# Patient Record
Sex: Male | Born: 1955
Health system: Southern US, Community
[De-identification: ages and names within clinical notes are randomized; demographics above are authoritative.]

## PROBLEM LIST (undated history)

## (undated) DIAGNOSIS — Z8669 Personal history of other diseases of the nervous system and sense organs: Secondary | ICD-10-CM

## (undated) DIAGNOSIS — F1011 Alcohol abuse, in remission: Secondary | ICD-10-CM

## (undated) DIAGNOSIS — Z789 Other specified health status: Secondary | ICD-10-CM

## (undated) HISTORY — DX: Personal history of other diseases of the nervous system and sense organs: Z86.69

## (undated) HISTORY — DX: Alcohol abuse, in remission: F10.11

---

## 1993-03-20 HISTORY — PX: KNEE ARTHROSCOPY: SHX127

## 2001-05-19 ENCOUNTER — Emergency Department (HOSPITAL_COMMUNITY): Admission: EM | Admit: 2001-05-19 | Discharge: 2001-05-19 | Payer: Self-pay | Admitting: Emergency Medicine

## 2002-10-13 ENCOUNTER — Encounter: Admission: RE | Admit: 2002-10-13 | Discharge: 2002-10-13 | Payer: Self-pay | Admitting: Internal Medicine

## 2002-10-13 ENCOUNTER — Encounter: Payer: Self-pay | Admitting: Internal Medicine

## 2007-01-29 ENCOUNTER — Ambulatory Visit (HOSPITAL_COMMUNITY): Admission: RE | Admit: 2007-01-29 | Discharge: 2007-01-29 | Payer: Self-pay | Admitting: Chiropractic Medicine

## 2013-09-09 ENCOUNTER — Ambulatory Visit: Payer: Self-pay | Admitting: Medical

## 2014-01-23 ENCOUNTER — Ambulatory Visit: Payer: Self-pay | Admitting: General Practice

## 2014-02-06 ENCOUNTER — Ambulatory Visit: Payer: Self-pay | Admitting: General Practice

## 2014-02-27 ENCOUNTER — Ambulatory Visit (INDEPENDENT_AMBULATORY_CARE_PROVIDER_SITE_OTHER): Payer: Self-pay | Admitting: Surgery

## 2014-02-27 NOTE — H&P (Signed)
ouglas D. York 02/27/2014 9:51 AM Location: [facility] Patient #: [id number] DOB: 07/08/1955 Married / Language: English / Race: White Male History of Present Illness (Derrick Lainez A. Cortavius Montesinos MD; 02/27/2014 12:14 PM) Patient words: hernia  RIH  PT PRESENTS FOR RIH. PT HAS HAD PAIN AND SWELLING RIGHT GROIN LAST 3 MONTHS.. PAIN AND BULGE IN RIGHT GROIN. NO PAIN IN LEFT GROIN. MADE WORSE WITH LIFTING.  The patient is a 58 year old male   Other Problems (Derrick York, CMA; 02/27/2014 9:51 AM) No pertinent past medical history  Past Surgical History (Derrick York, CMA; 02/27/2014 9:51 AM) Knee Surgery Right.  Diagnostic Studies History (Derrick York, CMA; 02/27/2014 9:51 AM) Colonoscopy never  Allergies (Derrick York, CMA; 02/27/2014 9:52 AM) No Known Drug Allergies 02/27/2014  Medication History (Derrick York, CMA; 02/27/2014 9:52 AM) No Current Medications  Social History (Derrick York, CMA; 02/27/2014 9:51 AM) Alcohol use Remotely quit alcohol use. Caffeine use Tea. Illicit drug use Remotely quit drug use. Tobacco use Former smoker.  Family History (Derrick York, CMA; 02/27/2014 9:51 AM) Alcohol Abuse Father, Mother. Hypertension Mother. Malignant Neoplasm Of Pancreas Mother.     Review of Systems (Derrick York CMA; 02/27/2014 9:51 AM) General Not Present- Appetite Loss, Chills, Fatigue, Fever, Night Sweats, Weight Gain and Weight Loss. Skin Not Present- Change in Wart/Mole, Dryness, Hives, Jaundice, New Lesions, Non-Healing Wounds, Rash and Ulcer. HEENT Not Present- Earache, Hearing Loss, Hoarseness, Nose Bleed, Oral Ulcers, Ringing in the Ears, Seasonal Allergies, Sinus Pain, Sore Throat, Visual Disturbances, Wears glasses/contact lenses and Yellow Eyes. Respiratory Not Present- Bloody sputum, Chronic Cough, Difficulty Breathing, Snoring and Wheezing. Breast Not Present- Breast Mass, Breast Pain, Nipple Discharge and Skin Changes. Cardiovascular Not  Present- Chest Pain, Difficulty Breathing Lying Down, Leg Cramps, Palpitations, Rapid Heart Rate, Shortness of Breath and Swelling of Extremities. Gastrointestinal Not Present- Abdominal Pain, Bloating, Bloody Stool, Change in Bowel Habits, Chronic diarrhea, Constipation, Difficulty Swallowing, Excessive gas, Gets full quickly at meals, Hemorrhoids, Indigestion, Nausea, Rectal Pain and Vomiting. Male Genitourinary Not Present- Blood in Urine, Change in Urinary Stream, Frequency, Impotence, Nocturia, Painful Urination, Urgency and Urine Leakage. Musculoskeletal Not Present- Back Pain, Joint Pain, Joint Stiffness, Muscle Pain, Muscle Weakness and Swelling of Extremities. Neurological Not Present- Decreased Memory, Fainting, Headaches, Numbness, Seizures, Tingling, Tremor, Trouble walking and Weakness. Psychiatric Not Present- Anxiety, Bipolar, Change in Sleep Pattern, Depression, Fearful and Frequent crying. Endocrine Not Present- Cold Intolerance, Excessive Hunger, Hair Changes, Heat Intolerance, Hot flashes and New Diabetes. Hematology Not Present- Easy Bruising, Excessive bleeding, Gland problems, HIV and Persistent Infections.  Vitals (Derrick York CMA; 02/27/2014 9:52 AM) 02/27/2014 9:51 AM Weight: 190 lb Height: 72in Body Surface Area: 2.09 m Body Mass Index: 25.77 kg/m Temp.: 97F(Temporal)  Pulse: 78 (Regular)  BP: 126/80 (Sitting, Left Arm, Standard)     Physical Exam (Derrick York A. Derrick Gohlke MD; 02/27/2014 12:15 PM)  General Mental Status-Alert. General Appearance-Consistent with stated age. Hydration-Well hydrated. Voice-Normal.  Head and Neck Head-normocephalic, atraumatic with no lesions or palpable masses. Trachea-midline.  Eye Eyeball - Bilateral-Extraocular movements intact. Sclera/Conjunctiva - Bilateral-No scleral icterus.  Chest and Lung Exam Chest and lung exam reveals -quiet, even and easy respiratory effort with no use of accessory  muscles and on auscultation, normal breath sounds, no adventitious sounds and normal vocal resonance. Inspection Chest Wall - Normal. Back - normal.  Cardiovascular Cardiovascular examination reveals -normal heart sounds, regular rate and rhythm with no murmurs and normal pedal pulses bilaterally.  Abdomen Inspection Skin - Scar - no   surgical scars. Hernias - Inguinal hernia - Right - Reducible. Palpation/Percussion Palpation and Percussion of the abdomen reveal - Soft, Non Tender, No Rebound tenderness, No Rigidity (guarding) and No hepatosplenomegaly. Auscultation Auscultation of the abdomen reveals - Bowel sounds normal.  Neurologic Neurologic evaluation reveals -alert and oriented x 3 with no impairment of recent or remote memory. Mental Status-Normal.  Musculoskeletal Normal Exam - Left-Upper Extremity Strength Normal and Lower Extremity Strength Normal. Normal Exam - Right-Upper Extremity Strength Normal, Lower Extremity Weakness.    Assessment & Plan (Derrick York A. Derrick Mineo MD; 02/27/2014 10:14 AM)  UNILATERAL INGUINAL HERNIA WITHOUT OBSTRUCTION OR GANGRENE, RECURRENCE NOT SPECIFIED (550.90  K40.90) Impression: RIH reducible. PT DESIRES REPAIR. The risk of hernia repair include bleeding, infection, organ injury, bowel injury, bladder injury, nerve injury recurrent hernia, blood clots, worsening of underlying condition, chronic pain, mesh use, open surgery, death, and the need for other operattions. Pt agrees to proceed. DISCUSSED LAPAROSCOPIC AND OPEN REPAIR WITH MESH.  Current Plans Pt Education - [facility] Umbilical/ Inguinal Hernia [facility] 

## 2014-03-09 ENCOUNTER — Encounter (HOSPITAL_BASED_OUTPATIENT_CLINIC_OR_DEPARTMENT_OTHER)
Admission: RE | Admit: 2014-03-09 | Discharge: 2014-03-09 | Disposition: A | Discharge status: Home / Self Care | Payer: Self-pay | Source: Ambulatory Visit | Attending: Surgery | Admitting: Surgery

## 2014-03-09 ENCOUNTER — Encounter (HOSPITAL_BASED_OUTPATIENT_CLINIC_OR_DEPARTMENT_OTHER): Payer: Self-pay | Admitting: *Deleted

## 2014-03-09 DIAGNOSIS — Z87891 Personal history of nicotine dependence: Secondary | ICD-10-CM | POA: Diagnosis not present

## 2014-03-09 DIAGNOSIS — K4090 Unilateral inguinal hernia, without obstruction or gangrene, not specified as recurrent: Secondary | ICD-10-CM | POA: Diagnosis not present

## 2014-03-09 LAB — COMPREHENSIVE METABOLIC PANEL
ALK PHOS: 68 U/L (ref 39–117)
ALT: 71 U/L — ABNORMAL HIGH (ref 0–53)
ANION GAP: 12 (ref 5–15)
AST: 68 U/L — ABNORMAL HIGH (ref 0–37)
Albumin: 4 g/dL (ref 3.5–5.2)
BILIRUBIN TOTAL: 0.5 mg/dL (ref 0.3–1.2)
BUN: 9 mg/dL (ref 6–23)
CO2: 29 mEq/L (ref 19–32)
CREATININE: 0.76 mg/dL (ref 0.50–1.35)
Calcium: 9.4 mg/dL (ref 8.4–10.5)
Chloride: 101 mEq/L (ref 96–112)
GFR calc Af Amer: >90 mL/min (ref >90)
GLUCOSE: 82 mg/dL (ref 70–99)
Potassium: 4.3 mEq/L (ref 3.7–5.3)
SODIUM: 142 meq/L (ref 137–147)
Total Protein: 7.3 g/dL (ref 6.0–8.3)

## 2014-03-09 LAB — CBC WITH DIFFERENTIAL/PLATELET
BASOS PCT: 1 % (ref 0–1)
Basophils Absolute: 0 10*3/uL (ref 0.0–0.1)
EOS ABS: 0.1 10*3/uL (ref 0.0–0.7)
EOS PCT: 2 % (ref 0–5)
HCT: 44 % (ref 39.0–52.0)
Hemoglobin: 15.4 g/dL (ref 13.0–17.0)
Lymphocytes Relative: 40 % (ref 12–46)
Lymphs Abs: 2.6 10*3/uL (ref 0.7–4.0)
MCH: 30.6 pg (ref 26.0–34.0)
MCHC: 35 g/dL (ref 30.0–36.0)
MCV: 87.3 fL (ref 78.0–100.0)
Monocytes Absolute: 0.6 10*3/uL (ref 0.1–1.0)
Monocytes Relative: 9 % (ref 3–12)
Neutro Abs: 3.2 10*3/uL (ref 1.7–7.7)
Neutrophils Relative %: 48 % (ref 43–77)
PLATELETS: 157 10*3/uL (ref 150–400)
RBC: 5.04 MIL/uL (ref 4.22–5.81)
RDW: 12.7 % (ref 11.5–15.5)
WBC: 6.5 10*3/uL (ref 4.0–10.5)

## 2014-03-11 ENCOUNTER — Encounter (HOSPITAL_BASED_OUTPATIENT_CLINIC_OR_DEPARTMENT_OTHER): Payer: Self-pay | Admitting: Anesthesiology

## 2014-03-11 ENCOUNTER — Ambulatory Visit (HOSPITAL_BASED_OUTPATIENT_CLINIC_OR_DEPARTMENT_OTHER)
Admission: RE | Admit: 2014-03-11 | Discharge: 2014-03-11 | Disposition: A | Discharge status: Home / Self Care | Payer: BC Managed Care – PPO | Source: Ambulatory Visit | Attending: Surgery | Admitting: Surgery

## 2014-03-11 ENCOUNTER — Ambulatory Visit (HOSPITAL_BASED_OUTPATIENT_CLINIC_OR_DEPARTMENT_OTHER): Payer: BC Managed Care – PPO | Admitting: Anesthesiology

## 2014-03-11 ENCOUNTER — Encounter (HOSPITAL_BASED_OUTPATIENT_CLINIC_OR_DEPARTMENT_OTHER)
Admission: RE | Disposition: A | Discharge status: Home / Self Care | Payer: Self-pay | Source: Ambulatory Visit | Attending: Surgery

## 2014-03-11 DIAGNOSIS — K4090 Unilateral inguinal hernia, without obstruction or gangrene, not specified as recurrent: Secondary | ICD-10-CM | POA: Insufficient documentation

## 2014-03-11 DIAGNOSIS — Z87891 Personal history of nicotine dependence: Secondary | ICD-10-CM | POA: Insufficient documentation

## 2014-03-11 HISTORY — PX: INGUINAL HERNIA REPAIR: SHX194

## 2014-03-11 HISTORY — PX: INSERTION OF MESH: SHX5868

## 2014-03-11 HISTORY — DX: Other specified health status: Z78.9

## 2014-03-11 SURGERY — REPAIR, HERNIA, INGUINAL, ADULT
Anesthesia: General | Laterality: Right

## 2014-03-11 MED ORDER — ROCURONIUM BROMIDE 100 MG/10ML IV SOLN
INTRAVENOUS | Status: DC | PRN
  Administered 2014-03-11: 40 mg via INTRAVENOUS
  Administered 2014-03-11: 10 mg via INTRAVENOUS

## 2014-03-11 MED ORDER — HYDROMORPHONE HCL 1 MG/ML IJ SOLN
INTRAMUSCULAR | Status: AC
  Filled 2014-03-11: qty 1

## 2014-03-11 MED ORDER — LACTATED RINGERS IV SOLN
INTRAVENOUS | Status: DC
  Administered 2014-03-11 (×2): via INTRAVENOUS

## 2014-03-11 MED ORDER — MIDAZOLAM HCL 2 MG/2ML IJ SOLN
INTRAMUSCULAR | Status: AC
  Filled 2014-03-11: qty 2

## 2014-03-11 MED ORDER — PROPOFOL 10 MG/ML IV BOLUS
INTRAVENOUS | Status: DC | PRN
  Administered 2014-03-11: 200 mg via INTRAVENOUS

## 2014-03-11 MED ORDER — FENTANYL CITRATE 0.05 MG/ML IJ SOLN
INTRAMUSCULAR | Status: AC
  Filled 2014-03-11: qty 2

## 2014-03-11 MED ORDER — CHLORHEXIDINE GLUCONATE 4 % EX LIQD: 1.0000 "application " | Freq: Once | CUTANEOUS | Status: DC

## 2014-03-11 MED ORDER — OXYCODONE-ACETAMINOPHEN 5-325 MG PO TABS: 1.0000 | ORAL_TABLET | ORAL | Status: DC | PRN

## 2014-03-11 MED ORDER — FENTANYL CITRATE 0.05 MG/ML IJ SOLN
50.0000 ug | INTRAMUSCULAR | Status: DC | PRN
  Administered 2014-03-11: 100 ug via INTRAVENOUS

## 2014-03-11 MED ORDER — LIDOCAINE HCL (CARDIAC) 20 MG/ML IV SOLN
INTRAVENOUS | Status: DC | PRN
  Administered 2014-03-11: 80 mg via INTRAVENOUS

## 2014-03-11 MED ORDER — DEXAMETHASONE SODIUM PHOSPHATE 4 MG/ML IJ SOLN
INTRAMUSCULAR | Status: DC | PRN
  Administered 2014-03-11: 10 mg via INTRAVENOUS

## 2014-03-11 MED ORDER — SCOPOLAMINE 1 MG/3DAYS TD PT72: 1.0000 | MEDICATED_PATCH | TRANSDERMAL | Status: DC

## 2014-03-11 MED ORDER — HYDROMORPHONE HCL 1 MG/ML IJ SOLN
0.2500 mg | INTRAMUSCULAR | Status: DC | PRN
  Administered 2014-03-11: 0.25 mg via INTRAVENOUS

## 2014-03-11 MED ORDER — ONDANSETRON HCL 4 MG/2ML IJ SOLN: 4.0000 mg | Freq: Once | INTRAMUSCULAR | Status: DC | PRN

## 2014-03-11 MED ORDER — GLYCOPYRROLATE 0.2 MG/ML IJ SOLN
INTRAMUSCULAR | Status: DC | PRN
  Administered 2014-03-11: 0.4 mg via INTRAVENOUS

## 2014-03-11 MED ORDER — BUPIVACAINE-EPINEPHRINE 0.25% -1:200000 IJ SOLN
INTRAMUSCULAR | Status: DC | PRN
  Administered 2014-03-11: 10 mL

## 2014-03-11 MED ORDER — ONDANSETRON HCL 4 MG/2ML IJ SOLN
INTRAMUSCULAR | Status: DC | PRN
  Administered 2014-03-11: 4 mg via INTRAVENOUS

## 2014-03-11 MED ORDER — DEXTROSE 5 % IV SOLN
3.0000 g | INTRAVENOUS | Status: AC
  Administered 2014-03-11: 2 g via INTRAVENOUS

## 2014-03-11 MED ORDER — LIDOCAINE HCL 4 % MT SOLN
OROMUCOSAL | Status: DC | PRN
  Administered 2014-03-11: 3 mL via TOPICAL

## 2014-03-11 MED ORDER — BUPIVACAINE-EPINEPHRINE (PF) 0.5% -1:200000 IJ SOLN
INTRAMUSCULAR | Status: AC
  Filled 2014-03-11: qty 30

## 2014-03-11 MED ORDER — SCOPOLAMINE 1 MG/3DAYS TD PT72
MEDICATED_PATCH | TRANSDERMAL | Status: AC
  Filled 2014-03-11: qty 1

## 2014-03-11 MED ORDER — NEOSTIGMINE METHYLSULFATE 10 MG/10ML IV SOLN
INTRAVENOUS | Status: DC | PRN
Start: 2014-03-11 — End: 2014-03-11
  Administered 2014-03-11: 3 mg via INTRAVENOUS

## 2014-03-11 MED ORDER — CEFAZOLIN SODIUM-DEXTROSE 2-3 GM-% IV SOLR
INTRAVENOUS | Status: AC
  Filled 2014-03-11: qty 50

## 2014-03-11 MED ORDER — FENTANYL CITRATE 0.05 MG/ML IJ SOLN
INTRAMUSCULAR | Status: AC
  Filled 2014-03-11: qty 4

## 2014-03-11 MED ORDER — FENTANYL CITRATE 0.05 MG/ML IJ SOLN
INTRAMUSCULAR | Status: DC | PRN
  Administered 2014-03-11 (×2): 50 ug via INTRAVENOUS

## 2014-03-11 MED ORDER — PROPOFOL 10 MG/ML IV BOLUS
INTRAVENOUS | Status: AC
  Filled 2014-03-11: qty 20

## 2014-03-11 MED ORDER — MIDAZOLAM HCL 2 MG/2ML IJ SOLN
1.0000 mg | INTRAMUSCULAR | Status: DC | PRN
  Administered 2014-03-11: 2 mg via INTRAVENOUS

## 2014-03-11 MED ORDER — BUPIVACAINE-EPINEPHRINE (PF) 0.5% -1:200000 IJ SOLN
INTRAMUSCULAR | Status: DC | PRN
  Administered 2014-03-11: 25 mL via PERINEURAL

## 2014-03-11 MED ORDER — EPHEDRINE SULFATE 50 MG/ML IJ SOLN
INTRAMUSCULAR | Status: DC | PRN
  Administered 2014-03-11 (×2): 10 mg via INTRAVENOUS

## 2014-03-11 SURGICAL SUPPLY — 49 items
BLADE CLIPPER SURG (BLADE) ×2 IMPLANT
BLADE SURG 15 STRL LF DISP TIS (BLADE) ×1 IMPLANT
BLADE SURG 15 STRL SS (BLADE) ×1
CANISTER SUCT 1200ML W/VALVE (MISCELLANEOUS) ×2 IMPLANT
CHLORAPREP W/TINT 26ML (MISCELLANEOUS) ×2 IMPLANT
COVER BACK TABLE 60X90IN (DRAPES) ×2 IMPLANT
COVER MAYO STAND STRL (DRAPES) ×2 IMPLANT
DECANTER SPIKE VIAL GLASS SM (MISCELLANEOUS) ×2 IMPLANT
DRAIN PENROSE 1/2X12 LTX STRL (WOUND CARE) ×2 IMPLANT
DRAPE LAPAROTOMY TRNSV 102X78 (DRAPE) ×2 IMPLANT
DRAPE UTILITY XL STRL (DRAPES) ×2 IMPLANT
ELECT COATED BLADE 2.86 ST (ELECTRODE) ×2 IMPLANT
ELECT REM PT RETURN 9FT ADLT (ELECTROSURGICAL) ×2
ELECTRODE REM PT RTRN 9FT ADLT (ELECTROSURGICAL) ×1 IMPLANT
GAUZE SPONGE 4X4 16PLY XRAY LF (GAUZE/BANDAGES/DRESSINGS) IMPLANT
GLOVE BIOGEL M STRL SZ7.5 (GLOVE) ×2 IMPLANT
GLOVE BIOGEL PI IND STRL 7.0 (GLOVE) ×1 IMPLANT
GLOVE BIOGEL PI IND STRL 8 (GLOVE) ×2 IMPLANT
GLOVE BIOGEL PI INDICATOR 7.0 (GLOVE) ×1
GLOVE BIOGEL PI INDICATOR 8 (GLOVE) ×2
GLOVE ECLIPSE 8.0 STRL XLNG CF (GLOVE) ×2 IMPLANT
GOWN STRL REUS W/ TWL LRG LVL3 (GOWN DISPOSABLE) ×2 IMPLANT
GOWN STRL REUS W/TWL LRG LVL3 (GOWN DISPOSABLE) ×2
LIQUID BAND (GAUZE/BANDAGES/DRESSINGS) ×2 IMPLANT
MESH HERNIA SYS ULTRAPRO LRG (Mesh General) ×2 IMPLANT
NEEDLE HYPO 25X1 1.5 SAFETY (NEEDLE) ×2 IMPLANT
NS IRRIG 1000ML POUR BTL (IV SOLUTION) ×2 IMPLANT
PACK BASIN DAY SURGERY FS (CUSTOM PROCEDURE TRAY) ×2 IMPLANT
PENCIL BUTTON HOLSTER BLD 10FT (ELECTRODE) ×2 IMPLANT
SLEEVE SCD COMPRESS KNEE MED (MISCELLANEOUS) ×2 IMPLANT
SPONGE GAUZE 4X4 12PLY STER LF (GAUZE/BANDAGES/DRESSINGS) IMPLANT
SPONGE LAP 4X18 X RAY DECT (DISPOSABLE) ×2 IMPLANT
STAPLER VISISTAT 35W (STAPLE) IMPLANT
SUT MON AB 4-0 PC3 18 (SUTURE) ×2 IMPLANT
SUT NOVA 0 T19/GS 22DT (SUTURE) ×4 IMPLANT
SUT VIC AB 0 SH 27 (SUTURE) ×2 IMPLANT
SUT VIC AB 2-0 SH 27 (SUTURE) ×1
SUT VIC AB 2-0 SH 27XBRD (SUTURE) ×1 IMPLANT
SUT VIC AB 3-0 54X BRD REEL (SUTURE) IMPLANT
SUT VIC AB 3-0 BRD 54 (SUTURE)
SUT VICRYL 3-0 CR8 SH (SUTURE) ×2 IMPLANT
SUT VICRYL AB 2 0 TIE (SUTURE) IMPLANT
SUT VICRYL AB 2 0 TIES (SUTURE)
SYR CONTROL 10ML LL (SYRINGE) ×2 IMPLANT
TAPE HYPAFIX 4 X10 (GAUZE/BANDAGES/DRESSINGS) IMPLANT
TOWEL OR 17X24 6PK STRL BLUE (TOWEL DISPOSABLE) ×2 IMPLANT
TOWEL OR NON WOVEN STRL DISP B (DISPOSABLE) ×2 IMPLANT
TUBE CONNECTING 20X1/4 (TUBING) ×2 IMPLANT
YANKAUER SUCT BULB TIP NO VENT (SUCTIONS) ×2 IMPLANT

## 2014-03-11 NOTE — Progress Notes (Signed)
Assisted Dr. Crews with right, ultrasound guided, transabdominal plane block. Side rails up, monitors on throughout procedure. See vital signs in flow sheet. Tolerated Procedure well. 

## 2014-03-11 NOTE — Anesthesia Procedure Notes (Addendum)
Procedure Name: Intubation Date/Time: 03/11/2014 8:34 AM Performed by: Maryella Shivers Pre-anesthesia Checklist: Patient identified, Emergency Drugs available, Suction available and Patient being monitored Patient Re-evaluated:Patient Re-evaluated prior to inductionOxygen Delivery Method: Circle System Utilized Preoxygenation: Pre-oxygenation with 100% oxygen Intubation Type: IV induction Ventilation: Mask ventilation without difficulty Laryngoscope Size: Mac and 3 Grade View: Grade I Tube type: Oral Tube size: 8.0 mm Number of attempts: 1 Airway Equipment and Method: stylet and oral airway Placement Confirmation: ETT inserted through vocal cords under direct vision,  positive ETCO2 and breath sounds checked- equal and bilateral Secured at: 24 cm Tube secured with: Tape Dental Injury: Teeth and Oropharynx as per pre-operative assessment    Anesthesia Regional Block:  TAP block  Pre-Anesthetic Checklist: ,, timeout performed, Correct Patient, Correct Site, Correct Laterality, Correct Procedure, Correct Position, site marked, Risks and benefits discussed,  Surgical consent,  Pre-op evaluation,  At surgeon's request and post-op pain management  Laterality: Right and Lower  Prep: chloraprep       Needles:  Injection technique: Single-shot  Needle Type: Echogenic Needle     Needle Length: 9cm 9 cm Needle Gauge: 21 and 21 G    Additional Needles:  Procedures: ultrasound guided (picture in chart) TAP block Narrative:  Start time: 03/11/2014 8:10 AM End time: 03/11/2014 8:16 AM Injection made incrementally with aspirations every 5 mL.  Performed by: Personally  Anesthesiologist: [place], [place]

## 2014-03-11 NOTE — Anesthesia Preprocedure Evaluation (Signed)
Anesthesia Evaluation  Patient identified by MRN, date of birth, ID band Patient awake    Reviewed: Allergy & Precautions, H&P , NPO status , Patient's Chart, lab work & pertinent test results  Airway Mallampati: I  TM Distance: >3 FB Neck ROM: Full    Dental  (+) Teeth Intact, Dental Advisory Given   Pulmonary former smoker,  breath sounds clear to auscultation        Cardiovascular Rhythm:Regular Rate:Normal     Neuro/Psych    GI/Hepatic   Endo/Other    Renal/GU      Musculoskeletal   Abdominal   Peds  Hematology   Anesthesia Other Findings   Reproductive/Obstetrics                             Anesthesia Physical Anesthesia Plan  ASA: II  Anesthesia Plan: General   Post-op Pain Management:    Induction: Intravenous  Airway Management Planned: LMA and Oral ETT  Additional Equipment:   Intra-op Plan:   Post-operative Plan: Extubation in OR  Informed Consent: I have reviewed the patients History and Physical, chart, labs and discussed the procedure including the risks, benefits and alternatives for the proposed anesthesia with the patient or authorized representative who has indicated his/her understanding and acceptance.   Dental advisory given  Plan Discussed with: CRNA, Anesthesiologist and Surgeon  Anesthesia Plan Comments:         Anesthesia Quick Evaluation

## 2014-03-11 NOTE — Transfer of Care (Signed)
Immediate Anesthesia Transfer of Care Note  Patient: Derrick York  Procedure(s) Performed: Procedure(s): RIGHT INGUINAL HERNIA REPAIR  (Right) INSERTION OF MESH (Right)  Patient Location: PACU  Anesthesia Type:GA combined with regional for post-op pain  Level of Consciousness: sedated  Airway & Oxygen Therapy: Patient Spontanous Breathing and Patient connected to face mask oxygen  Post-op Assessment: Report given to PACU RN and Post -op Vital signs reviewed and stable  Post vital signs: Reviewed and stable  Complications: No apparent anesthesia complications

## 2014-03-11 NOTE — Discharge Instructions (Signed)
[facility] _______[facility] ° °UMBILICAL OR INGUINAL HERNIA REPAIR: POST OP INSTRUCTIONS ° °Always review your discharge instruction sheet given to you by the facility where your surgery was performed. °IF YOU HAVE DISABILITY OR FAMILY LEAVE FORMS, YOU MUST BRING THEM TO THE OFFICE FOR PROCESSING.   °DO NOT GIVE THEM TO YOUR DOCTOR. ° °1. A  prescription for pain medication may be given to you upon discharge.  Take your pain medication as prescribed, if needed.  If narcotic pain medicine is not needed, then you may take acetaminophen (Tylenol) or ibuprofen (Advil) as needed. °2. Take your usually prescribed medications unless otherwise directed. °3. If you need a refill on your pain medication, please contact your pharmacy.  They will contact our office to request authorization. Prescriptions will not be filled after 5 pm or on week-ends. °4. You should follow a light diet the first 24 hours after arrival home, such as soup and crackers, etc.  Be sure to include lots of fluids daily.  Resume your normal diet the day after surgery. °5. Most patients will experience some swelling and bruising around the umbilicus or in the groin and scrotum.  Ice packs and reclining will help.  Swelling and bruising can take several days to resolve.  °6. It is common to experience some constipation if taking pain medication after surgery.  Increasing fluid intake and taking a stool softener (such as Colace) will usually help or prevent this problem from occurring.  A mild laxative (Milk of Magnesia or Miralax) should be taken according to package directions if there are no bowel movements after 48 hours. °7. Unless discharge instructions indicate otherwise, you may remove your bandages 24-48 hours after surgery, and you may shower at that time.  You may have steri-strips (small skin tapes) in place directly over the incision.  These strips should be left on the skin for 7-10 days.  If your surgeon used skin glue on the  incision, you may shower in 24 hours.  The glue will flake off over the next 2-3 weeks.  Any sutures or staples will be removed at the office during your follow-up visit. °8. ACTIVITIES:  You may resume regular (light) daily activities beginning the next day--such as daily self-care, walking, climbing stairs--gradually increasing activities as tolerated.  You may have sexual intercourse when it is comfortable.  Refrain from any heavy lifting or straining until approved by your doctor. °a. You may drive when you are no longer taking prescription pain medication, you can comfortably wear a seatbelt, and you can safely maneuver your car and apply brakes. °b. RETURN TO WORK:  __________________________________________________________ °9. You should see your doctor in the office for a follow-up appointment approximately 2-3 weeks after your surgery.  Make sure that you call for this appointment within a day or two after you arrive home to insure a convenient appointment time. °10. OTHER INSTRUCTIONS:  __________________________________________________________________________________________________________________________________________________________________________________________  °WHEN TO CALL YOUR DOCTOR: °1. Fever over 101.0 °2. Inability to urinate °3. Nausea and/or vomiting °4. Extreme swelling or bruising °5. Continued bleeding from incision. °6. Increased pain, redness, or drainage from the incision ° °The clinic staff is available to answer your questions during regular business hours.  Please don’t hesitate to call and ask to speak to one of the nurses for clinical concerns.  If you have a medical emergency, go to the nearest emergency room or call 911.  A surgeon from [facility] is always on call at the hospital ° ° °  [address], [address], [place], [place]  [address] ? ° [address] [address], [place], [place]   [address] °[phone] ? [phone] ? FAX [phone] °Web site:  [email or web address] ° ° ° °Post Anesthesia Home Care Instructions ° °Activity: °Get plenty of rest for the remainder of the day. A responsible adult should stay with you for 24 hours following the procedure.  °For the next 24 hours, DO NOT: °-Drive a car °-Operate machinery °-Drink alcoholic beverages °-Take any medication unless instructed by your physician °-Make any legal decisions or sign important papers. ° °Meals: °Start with liquid foods such as gelatin or soup. Progress to regular foods as tolerated. Avoid greasy, spicy, heavy foods. If nausea and/or vomiting occur, drink only clear liquids until the nausea and/or vomiting subsides. Call your physician if vomiting continues. ° °Special Instructions/Symptoms: °Your throat may feel dry or sore from the anesthesia or the breathing tube placed in your throat during surgery. If this causes discomfort, gargle with warm salt water. The discomfort should disappear within 24 hours. ° °

## 2014-03-11 NOTE — Anesthesia Postprocedure Evaluation (Signed)
  Anesthesia Post-op Note  Patient: Derrick York  Procedure(s) Performed: Procedure(s): RIGHT INGUINAL HERNIA REPAIR  (Right) INSERTION OF MESH (Right)  Patient Location: PACU  Anesthesia Type: General   Level of Consciousness: awake, alert  and oriented  Airway and Oxygen Therapy: Patient Spontanous Breathing  Post-op Pain: mild  Post-op Assessment: Post-op Vital signs reviewed  Post-op Vital Signs: Reviewed  Last Vitals:  Filed Vitals:   03/11/14 1042  BP:   Pulse: 76  Temp:   Resp: 15    Complications: No apparent anesthesia complications

## 2014-03-11 NOTE — Interval H&P Note (Signed)
History and Physical Interval Note:  03/11/2014 8:18 AM  Derrick York  has presented today for surgery, with the diagnosis of Right Inguinal Hernia  The various methods of treatment have been discussed with the patient and family. After consideration of risks, benefits and other options for treatment, the patient has consented to  Procedure(s): RIGHT INGUINAL HERNIA REPAIR  (Right) INSERTION OF MESH (Right) as a surgical intervention .  The patient's history has been reviewed, patient examined, no change in status, stable for surgery.  I have reviewed the patient's chart and labs.  Questions were answered to the patient's satisfaction.     Liliahna Cudd A.

## 2014-03-11 NOTE — Op Note (Signed)
Right inguinalHernia, Open, Procedure Note with [facility]HS mesh  Indications: The patient presented with a history of a right, reducible inguinal  hernia.  The risk of hernia repair include bleeding,  Infection,   Recurrence of the hernia,  Mesh use, chronic pain,  Organ injury,  Bowel injury,  Bladder injury,   nerve injury with numbness around the incision,  Death,  and worsening of preexisting  medical problems.  The alternatives to surgery have been discussed as well..  Long term expectations of both operative and non operative treatments have been discussed.   The patient agrees to proceed.  Pre-operative Diagnosis: right reducible inguinal hernia  Post-operative Diagnosis: same  Surgeon: Erroll Luna A.   Assistants: none  Anesthesia: General endotracheal anesthesia with TEP  ASA Class: 2  Procedure Details  The patient was seen again in the Holding Room. The risks, benefits, complications, treatment options, and expected outcomes were discussed with the patient. The possibilities of reaction to medication, pulmonary aspiration, perforation of viscus, bleeding, recurrent infection, the need for additional procedures, and development of a complication requiring transfusion or further operation were discussed with the patient and/or family. There was concurrence with the proposed plan, and informed consent was obtained. The site of surgery was properly noted/marked. The patient was taken to the Operating Room, identified as Derrick York, and the procedure verified as hernia repair. A Time Out was held and the above information confirmed.  The patient was placed in the supine position and underwent induction of anesthesia, the lower abdomen and groin was prepped and draped in the standard fashion, and 0.25% Marcaine with epinephrine was used to anesthetize the skin over the mid-portion of the inguinal canal. A transverse incision was made. Dissection was carried through the soft tissue to  expose the inguinal canal and inguinal ligament along its lower edge. The external oblique fascia was split along the course of its fibers, exposing the inguinal canal. The cord and nerve were looped using a Penrose drain and reflected out of the field. The defect was exposed and a piece of prolene hernia system ultrapro mesh was and placed into  The indirect  Defect after reduction of contents back into the preperitoneal space. . Interupted 2-0 novafil suture was then used  to repair the defect, with the suture being sewn from the pubic tubercle inferiorly and superiorly along the canal to a level just beyond the internal ring. The mesh was split to allow passage of the cord and nerve into the canal without entrapment. The contents were then returned to canal and the external oblique fashion was then closed in a continuous fashion using 3-0 Vicryl suture taking care not to cause entrapment. Scarpa's layer closed with 3 0 vicryl and 4 0 monocryl used to close the skin.  Dermabond used for dressing.  Instrument, sponge, and needle counts were correct prior to closure and at the conclusion of the case.  Findings: Hernia as above  Estimated Blood Loss: Minimal         Drains: None         Total IV Fluids: 600  mL         Specimens: none               Complications: None; patient tolerated the procedure well.         Disposition: PACU - hemodynamically stable.         Condition: stable

## 2014-03-11 NOTE — H&P (View-Only) (Signed)
ouglas D. York 02/27/2014 9:51 AM Location: [facility] Patient #: [id number] DOB: 1955-08-15 Married / Language: English / Race: White Male History of Present Illness Derrick York A. Cathlene Gardella MD; 02/27/2014 12:14 PM) Patient words: hernia  RIH  PT PRESENTS FOR RIH. PT HAS HAD PAIN AND SWELLING RIGHT GROIN LAST 3 MONTHS.Marland Kitchen PAIN AND BULGE IN RIGHT GROIN. NO PAIN IN LEFT GROIN. MADE WORSE WITH LIFTING.  The patient is a 58 year old male   Other Problems Derrick York, [place]; 02/27/2014 9:51 AM) No pertinent past medical history  Past Surgical History Derrick York, [place]; 02/27/2014 9:51 AM) Knee Surgery Right.  Diagnostic Studies History Derrick York, [place]; 02/27/2014 9:51 AM) Colonoscopy never  Allergies Derrick York, CMA; 02/27/2014 9:52 AM) No Known Drug Allergies 02/27/2014  Medication History (Derrick York, CMA; 02/27/2014 9:52 AM) No Current Medications  Social History Derrick York, [place]; 02/27/2014 9:51 AM) Alcohol use Remotely quit alcohol use. Caffeine use Tea. Illicit drug use Remotely quit drug use. Tobacco use Former smoker.  Family History Derrick York, [place]; 02/27/2014 9:51 AM) Alcohol Abuse Father, Mother. Hypertension Mother. Malignant Neoplasm Of Pancreas Mother.     Review of Systems Derrick York CMA; 02/27/2014 9:51 AM) General Not Present- Appetite Loss, Chills, Fatigue, Fever, Night Sweats, Weight Gain and Weight Loss. Skin Not Present- Change in Wart/Mole, Dryness, Hives, Jaundice, New Lesions, Non-Healing Wounds, Rash and Ulcer. HEENT Not Present- Earache, Hearing Loss, Hoarseness, Nose Bleed, Oral Ulcers, Ringing in the Ears, Seasonal Allergies, Sinus Pain, Sore Throat, Visual Disturbances, Wears glasses/contact lenses and Yellow Eyes. Respiratory Not Present- Bloody sputum, Chronic Cough, Difficulty Breathing, Snoring and Wheezing. Breast Not Present- Breast Mass, Breast Pain, Nipple Discharge and Skin Changes. Cardiovascular Not  Present- Chest Pain, Difficulty Breathing Lying Down, Leg Cramps, Palpitations, Rapid Heart Rate, Shortness of Breath and Swelling of Extremities. Gastrointestinal Not Present- Abdominal Pain, Bloating, Bloody Stool, Change in Bowel Habits, Chronic diarrhea, Constipation, Difficulty Swallowing, Excessive gas, Gets full quickly at meals, Hemorrhoids, Indigestion, Nausea, Rectal Pain and Vomiting. Male Genitourinary Not Present- Blood in Urine, Change in Urinary Stream, Frequency, Impotence, Nocturia, Painful Urination, Urgency and Urine Leakage. Musculoskeletal Not Present- Back Pain, Joint Pain, Joint Stiffness, Muscle Pain, Muscle Weakness and Swelling of Extremities. Neurological Not Present- Decreased Memory, Fainting, Headaches, Numbness, Seizures, Tingling, Tremor, Trouble walking and Weakness. Psychiatric Not Present- Anxiety, Bipolar, Change in Sleep Pattern, Depression, Fearful and Frequent crying. Endocrine Not Present- Cold Intolerance, Excessive Hunger, Hair Changes, Heat Intolerance, Hot flashes and New Diabetes. Hematology Not Present- Easy Bruising, Excessive bleeding, Gland problems, HIV and Persistent Infections.  Vitals (Derrick York CMA; 02/27/2014 9:52 AM) 02/27/2014 9:51 AM Weight: 190 lb Height: 72in Body Surface Area: 2.09 m Body Mass Index: 25.77 kg/m Temp.: 21F(Temporal)  Pulse: 78 (Regular)  BP: 126/80 (Sitting, Left Arm, Standard)     Physical Exam (Darnell Jeschke A. Donyale Berthold MD; 02/27/2014 12:15 PM)  General Mental Status-Alert. General Appearance-Consistent with stated age. Hydration-Well hydrated. Voice-Normal.  Head and Neck Head-normocephalic, atraumatic with no lesions or palpable masses. Trachea-midline.  Eye Eyeball - Bilateral-Extraocular movements intact. Sclera/Conjunctiva - Bilateral-No scleral icterus.  Chest and Lung Exam Chest and lung exam reveals -quiet, even and easy respiratory effort with no use of accessory  muscles and on auscultation, normal breath sounds, no adventitious sounds and normal vocal resonance. Inspection Chest Wall - Normal. Back - normal.  Cardiovascular Cardiovascular examination reveals -normal heart sounds, regular rate and rhythm with no murmurs and normal pedal pulses bilaterally.  Abdomen Inspection Skin - Scar - no  surgical scars. Hernias - Inguinal hernia - Right - Reducible. Palpation/Percussion Palpation and Percussion of the abdomen reveal - Soft, Non Tender, No Rebound tenderness, No Rigidity (guarding) and No hepatosplenomegaly. Auscultation Auscultation of the abdomen reveals - Bowel sounds normal.  Neurologic Neurologic evaluation reveals -alert and oriented x 3 with no impairment of recent or remote memory. Mental Status-Normal.  Musculoskeletal Normal Exam - Left-Upper Extremity Strength Normal and Lower Extremity Strength Normal. Normal Exam - Right-Upper Extremity Strength Normal, Lower Extremity Weakness.    Assessment & Plan (Rosela Supak A. Nadezhda Pollitt MD; 02/27/2014 10:14 AM)  UNILATERAL INGUINAL HERNIA WITHOUT OBSTRUCTION OR GANGRENE, RECURRENCE NOT SPECIFIED (550.90  K40.90) Impression: RIH reducible. PT DESIRES REPAIR. The risk of hernia repair include bleeding, infection, organ injury, bowel injury, bladder injury, nerve injury recurrent hernia, blood clots, worsening of underlying condition, chronic pain, mesh use, open surgery, death, and the need for other operattions. Pt agrees to proceed. DISCUSSED LAPAROSCOPIC AND OPEN REPAIR WITH MESH.  Current Plans Pt Education - [facility] Umbilical/ Inguinal Hernia HCI

## 2014-03-16 ENCOUNTER — Encounter (HOSPITAL_BASED_OUTPATIENT_CLINIC_OR_DEPARTMENT_OTHER): Payer: Self-pay | Admitting: Surgery

## 2018-03-20 DIAGNOSIS — Z8619 Personal history of other infectious and parasitic diseases: Secondary | ICD-10-CM

## 2018-03-20 HISTORY — DX: Personal history of other infectious and parasitic diseases: Z86.19

## 2018-05-09 ENCOUNTER — Encounter: Payer: Self-pay | Admitting: Family Medicine

## 2018-05-09 ENCOUNTER — Encounter (INDEPENDENT_AMBULATORY_CARE_PROVIDER_SITE_OTHER): Payer: Self-pay

## 2018-05-09 ENCOUNTER — Ambulatory Visit: Payer: BC Managed Care – PPO | Admitting: Family Medicine

## 2018-05-09 VITALS — BP 108/62 | HR 66 | Temp 98.1°F | Ht 70.0 in | Wt 179.2 lb

## 2018-05-09 DIAGNOSIS — Z1211 Encounter for screening for malignant neoplasm of colon: Secondary | ICD-10-CM

## 2018-05-09 DIAGNOSIS — M25561 Pain in right knee: Secondary | ICD-10-CM | POA: Insufficient documentation

## 2018-05-09 DIAGNOSIS — R350 Frequency of micturition: Secondary | ICD-10-CM | POA: Diagnosis not present

## 2018-05-09 DIAGNOSIS — Z13220 Encounter for screening for lipoid disorders: Secondary | ICD-10-CM

## 2018-05-09 DIAGNOSIS — Z1159 Encounter for screening for other viral diseases: Secondary | ICD-10-CM | POA: Diagnosis not present

## 2018-05-09 DIAGNOSIS — Z131 Encounter for screening for diabetes mellitus: Secondary | ICD-10-CM

## 2018-05-09 DIAGNOSIS — T148XXA Other injury of unspecified body region, initial encounter: Secondary | ICD-10-CM

## 2018-05-09 HISTORY — DX: Pain in right knee: M25.561

## 2018-05-09 NOTE — Patient Instructions (Signed)
#Blood work today  #Colon cancer screen   Increased urination  - likely related to increased water intake - could be enlarged Prostate - if worsening let me know   Benign Prostatic Hyperplasia  Benign prostatic hyperplasia (BPH) is an enlarged prostate gland that is caused by the normal aging process and not by cancer. The prostate is a walnut-sized gland that is involved in the production of semen. It is located in front of the rectum and below the bladder. The bladder stores urine and the urethra is the tube that carries the urine out of the body. The prostate may get bigger as a man gets older. An enlarged prostate can press on the urethra. This can make it harder to pass urine. The build-up of urine in the bladder can cause infection. Back pressure and infection may progress to bladder damage and kidney (renal) failure. What are the causes? This condition is part of a normal aging process. However, not all men develop problems from this condition. If the prostate enlarges away from the urethra, urine flow will not be blocked. If it enlarges toward the urethra and compresses it, there will be problems passing urine. What increases the risk? This condition is more likely to develop in men over the age of 78 years. What are the signs or symptoms? Symptoms of this condition include:  Getting up often during the night to urinate.  Needing to urinate frequently during the day.  Difficulty starting urine flow.  Decrease in size and strength of your urine stream.  Leaking (dribbling) after urinating.  Inability to pass urine. This needs immediate treatment.  Inability to completely empty your bladder.  Pain when you pass urine. This is more common if there is also an infection.  Urinary tract infection (UTI). How is this diagnosed? This condition is diagnosed based on your medical history, a physical exam, and your symptoms. Tests will also be done, such as:  A post-void bladder  scan. This measures any amount of urine that may remain in your bladder after you finish urinating.  A digital rectal exam. In a rectal exam, your health care provider checks your prostate by putting a lubricated, gloved finger into your rectum to feel the back of your prostate gland. This exam detects the size of your gland and any abnormal lumps or growths.  An exam of your urine (urinalysis).  A prostate specific antigen (PSA) screening. This is a blood test used to screen for prostate cancer.  An ultrasound. This test uses sound waves to electronically produce a picture of your prostate gland. Your health care provider may refer you to a specialist in kidney and prostate diseases (urologist). How is this treated? Once symptoms begin, your health care provider will monitor your condition (active surveillance or watchful waiting). Treatment for this condition will depend on the severity of your condition. Treatment may include:  Observation and yearly exams. This may be the only treatment needed if your condition and symptoms are mild.  Medicines to relieve your symptoms, including: ? Medicines to shrink the prostate. ? Medicines to relax the muscle of the prostate.  Surgery in severe cases. Surgery may include: ? Prostatectomy. In this procedure, the prostate tissue is removed completely through an open incision or with a laparascope or robotics. ? Transurethral resection of the prostate (TURP). In this procedure, a tool is inserted through the opening at the tip of the penis (urethra). It is used to cut away tissue of the inner core of the  prostate. The pieces are removed through the same opening of the penis. This removes the blockage. ? Transurethral incision (TUIP). In this procedure, small cuts are made in the prostate. This lessens the prostate's pressure on the urethra. ? Transurethral microwave thermotherapy (TUMT). This procedure uses microwaves to create heat. The heat destroys  and removes a small amount of prostate tissue. ? Transurethral needle ablation (TUNA). This procedure uses radio frequencies to destroy and remove a small amount of prostate tissue. ? Interstitial laser coagulation ([place]). This procedure uses a laser to destroy and remove a small amount of prostate tissue. ? Transurethral electrovaporization (TUVP). This procedure uses electrodes to destroy and remove a small amount of prostate tissue. ? Prostatic urethral lift. This procedure inserts an implant to push the lobes of the prostate away from the urethra. Follow these instructions at home:  Take over-the-counter and prescription medicines only as told by your health care provider.  Monitor your symptoms for any changes. Contact your health care provider with any changes.  Avoid drinking large amounts of liquid before going to bed or out in public.  Avoid or reduce how much caffeine or alcohol you drink.  Give yourself time when you urinate.  Keep all follow-up visits as told by your health care provider. This is important. Contact a health care provider if:  You have unexplained back pain.  Your symptoms do not get better with treatment.  You develop side effects from the medicine you are taking.  Your urine becomes very dark or has a bad smell.  Your lower abdomen becomes distended and you have trouble passing your urine. Get help right away if:  You have a fever or chills.  You suddenly cannot urinate.  You feel lightheaded, or very dizzy, or you faint.  There are large amounts of blood or clots in the urine.  Your urinary problems become hard to manage.  You develop moderate to severe low back or flank pain. The flank is the side of your body between the ribs and the hip. These symptoms may represent a serious problem that is an emergency. Do not wait to see if the symptoms will go away. Get medical help right away. Call your local emergency services (911 in the [place] Do not  drive yourself to the hospital. Summary  Benign prostatic hyperplasia (BPH) is an enlarged prostate that is caused by the normal aging process and not by cancer.  An enlarged prostate can press on the urethra. This can make it hard to pass urine.  This condition is part of a normal aging process and is more likely to develop in men over the age of 67 years.  Get help right away if you suddenly cannot urinate. This information is not intended to replace advice given to you by your health care provider. Make sure you discuss any questions you have with your health care provider. Document Released: 03/06/2005 Document Revised: 04/10/2016 Document Reviewed: 04/10/2016 Elsevier Interactive Patient Education  2019 [employer].

## 2018-05-09 NOTE — Addendum Note (Signed)
Addended by: Ellamae Sia on: 05/09/2018 03:35 PM   Modules accepted: Orders

## 2018-05-09 NOTE — Progress Notes (Signed)
Subjective:     Derrick York is a 63 y.o. male presenting for Establish Care (no previous PCP.) and splinter in the finger (possibly pull this out.)     HPI  #splinter - since yesterday - wife tried to get it out this morning w/ a needle - caught it on a drum stick  #Increased urination - started drinking 84 oz of water a day in December - symptoms started in Jan - increased frequency - intermittent nocturia    Review of Systems  Genitourinary: Positive for frequency. Negative for difficulty urinating, dysuria, hematuria and urgency.     Social History   Tobacco Use  Smoking Status Former Smoker  . Packs/day: 0.50  . Years: 5.00  . Pack years: 2.50  . Types: Cigarettes  . Last attempt to quit: 1980  . Years since quitting: 40.1  Smokeless Tobacco Never Used        Objective:    BP Readings from Last 3 Encounters:  05/09/18 108/62  03/11/14 116/68   Wt Readings from Last 3 Encounters:  05/09/18 179 lb 4 oz (81.3 kg)  03/11/14 190 lb (86.2 kg)    BP 108/62   Pulse 66   Temp 98.1 F (36.7 C)   Ht 5\' 10"  (1.778 m)   Wt 179 lb 4 oz (81.3 kg)   SpO2 98%   BMI 25.72 kg/m    Physical Exam Constitutional:      Appearance: Normal appearance. He is not ill-appearing or diaphoretic.  HENT:     Right Ear: External ear normal.     Left Ear: External ear normal.     Nose: Nose normal.  Eyes:     General: No scleral icterus.    Extraocular Movements: Extraocular movements intact.     Conjunctiva/sclera: Conjunctivae normal.  Neck:     Musculoskeletal: Neck supple.  Cardiovascular:     Rate and Rhythm: Normal rate and regular rhythm.     Heart sounds: No murmur.  Pulmonary:     Effort: Pulmonary effort is normal. No respiratory distress.     Breath sounds: Normal breath sounds. No wheezing.  Skin:    General: Skin is warm and dry.     Comments: Left palm with small splinter in place with some surrounding erythema  Neurological:     Mental  Status: He is alert. Mental status is at baseline.  Psychiatric:        Mood and Affect: Mood normal.           Assessment & Plan:   Problem List Items Addressed This Visit      Other   Urinary frequency - Primary    No nocturia, no dysuria. Suspect this is primarily related to increased hydration. Will screen for diabetes today. Recommended returning if worsening       Other Visit Diagnoses    Need for hepatitis C screening test       Relevant Orders   Hepatitis C antibody   Screening for hyperlipidemia       Relevant Orders   Lipid panel   Diabetes mellitus screening       Relevant Orders   Hemoglobin A1c   Colon cancer screening       Relevant Orders   Fecal occult blood, imunochemical   Splinter in skin         Discussed removal of splinter today, but pt felt confident he could remove it at home. Recommended soaking the hand first and  then trying to lift the splinter.   Return in about 1 year (around 05/10/2019).  Lesleigh Noe, MD

## 2018-05-09 NOTE — Assessment & Plan Note (Signed)
No nocturia, no dysuria. Suspect this is primarily related to increased hydration. Will screen for diabetes today. Recommended returning if worsening

## 2018-05-13 LAB — HEPATITIS C ANTIBODY
HEP C AB: REACTIVE — AB
SIGNAL TO CUT-OFF: 31.7 — ABNORMAL HIGH (ref <1.00)

## 2018-05-13 LAB — LIPID PANEL
Cholesterol: 131 mg/dL (ref <200)
HDL: 43 mg/dL (ref >=40)
LDL Cholesterol (Calc): 70 mg/dL (calc)
Non-HDL Cholesterol (Calc): 88 mg/dL (calc) (ref <130)
Total CHOL/HDL Ratio: 3 (calc) (ref <5.0)
Triglycerides: 96 mg/dL (ref <150)

## 2018-05-13 LAB — HCV RNA,QUANTITATIVE REAL TIME PCR
HCV QUANT LOG: 6.1 {Log_IU}/mL — AB
HCV RNA, PCR, QN: 1250000 IU/mL — ABNORMAL HIGH

## 2018-05-13 LAB — HEMOGLOBIN A1C
Hgb A1c MFr Bld: 5.1 % of total Hgb (ref <5.7)
Mean Plasma Glucose: 100 (calc)
eAG (mmol/L): 5.5 (calc)

## 2018-05-14 ENCOUNTER — Telehealth: Payer: Self-pay | Admitting: Family Medicine

## 2018-05-14 DIAGNOSIS — K732 Chronic active hepatitis, not elsewhere classified: Secondary | ICD-10-CM

## 2018-05-14 NOTE — Telephone Encounter (Signed)
Called patient and got voicemail.   Asked him to call back if he received the message and let him know I would also try again around 1 pm.

## 2018-05-14 NOTE — Telephone Encounter (Signed)
Called and got voicemail again.   Left message that I would try again tomorrow.   If pt calls tomorrow morning, please attempt to get me as I want to follow-up with patient and have had difficulty reaching him.

## 2018-05-14 NOTE — Telephone Encounter (Signed)
Pt called back to speak to nurse concerning his last office visit. Please call pt.  Pt stated to please call him after 2pm

## 2018-05-15 DIAGNOSIS — B182 Chronic viral hepatitis C: Secondary | ICD-10-CM | POA: Insufficient documentation

## 2018-05-15 DIAGNOSIS — K732 Chronic active hepatitis, not elsewhere classified: Secondary | ICD-10-CM | POA: Insufficient documentation

## 2018-05-15 HISTORY — DX: Chronic viral hepatitis C: B18.2

## 2018-05-15 NOTE — Telephone Encounter (Signed)
Voicemail again. Left message  If patient returns call please come and find me to briefly speak with him.

## 2018-05-15 NOTE — Telephone Encounter (Signed)
Called to give patient the lab results.   Normal cholesterol and no diabetes  Unfortunately does have hepatitis C. Discussed that I felt this would be best treated through a speciality clinic that is in [place] and that I would place the referral.   He said he does not always answer his phone and requested a text message. I suggested he get MyChart which he would look into.

## 2018-05-15 NOTE — Assessment & Plan Note (Signed)
Referral placed.

## 2018-05-15 NOTE — Addendum Note (Signed)
Addended by: Lesleigh Noe on: 05/15/2018 12:17 PM   Modules accepted: Orders

## 2018-05-16 NOTE — Telephone Encounter (Signed)
Spoke with patient and gave him [facility] Liver care information for his Referral. Referral sent over to [facility], they will call patient to schedule.

## 2018-06-03 ENCOUNTER — Telehealth: Payer: Self-pay | Admitting: Family Medicine

## 2018-06-03 NOTE — Telephone Encounter (Signed)
Best number [phone]  Pt called wanting to know if Dr Einar Pheasant will drain a bakers cyst, its behind right knee. Or what would you recommend

## 2018-06-03 NOTE — Telephone Encounter (Signed)
Can try treatment here.   Would likely not drain the cyst but instead the joint and do a steroid injection as this is the typical first line treatment.   Lesleigh Noe  Also, if you speak with patient make sure he talks to [place] as well. It seems the liver care center has not been able to reach him

## 2018-06-04 NOTE — Telephone Encounter (Signed)
Patient called.  Please call patient back at [phone].

## 2018-06-04 NOTE — Telephone Encounter (Signed)
Left detailed message for patient on his voicemail as advised below. Awaiting to hear back from patient to see how he wants to proceed.

## 2019-03-26 LAB — HM HIV SCREENING LAB: HM HIV Screening: NEGATIVE

## 2019-04-11 ENCOUNTER — Other Ambulatory Visit: Payer: Self-pay | Admitting: Nurse Practitioner

## 2019-04-11 DIAGNOSIS — B182 Chronic viral hepatitis C: Secondary | ICD-10-CM

## 2019-04-21 ENCOUNTER — Other Ambulatory Visit: Payer: BC Managed Care – PPO

## 2019-04-22 ENCOUNTER — Ambulatory Visit
Admission: RE | Admit: 2019-04-22 | Discharge: 2019-04-22 | Disposition: A | Discharge status: Home / Self Care | Payer: BC Managed Care – PPO | Source: Ambulatory Visit | Attending: Nurse Practitioner | Admitting: Nurse Practitioner

## 2019-04-22 DIAGNOSIS — B182 Chronic viral hepatitis C: Secondary | ICD-10-CM

## 2019-05-26 ENCOUNTER — Telehealth: Payer: Self-pay | Admitting: Family Medicine

## 2019-05-26 NOTE — Telephone Encounter (Signed)
Patient called. He stated that he has been taking a Hep C Medication . And when he seen his liver specialist they advised that he needs to speak with his PCP. That he may need to get the HEP A vaccine.  Please advise

## 2019-05-27 NOTE — Telephone Encounter (Signed)
If liver specialist is recommending, patient could get a nurse visit to begin vaccine series.

## 2019-05-27 NOTE — Telephone Encounter (Signed)
Patient advised. Patient would like to hold off on a CPE until he is done with treatments.  I scheduled routine follow up visit for 06/16/19. Due to his availability and times available it was made for that day.  Does patient need to be worked in sooner and to get Hep A vaccine sooner?

## 2019-05-27 NOTE — Telephone Encounter (Signed)
Glad to hear he is seeing the liver specialist.   I would recommend that he schedule an office visit - if no specific concerns can do an annual wellness visit and we can give him the vaccine at that appointment.   Lesleigh Noe

## 2019-05-28 NOTE — Telephone Encounter (Signed)
Left message for patient to call back. Can also schedule him on a Monday sooner

## 2019-05-30 NOTE — Telephone Encounter (Signed)
Spoke with patient. Had a cancellation on 06/02/19 and patient agreed to re schedule to that date instead.

## 2019-06-02 ENCOUNTER — Other Ambulatory Visit: Payer: Self-pay

## 2019-06-02 ENCOUNTER — Ambulatory Visit (INDEPENDENT_AMBULATORY_CARE_PROVIDER_SITE_OTHER): Payer: BC Managed Care – PPO | Admitting: Family Medicine

## 2019-06-02 VITALS — BP 104/66 | HR 76 | Temp 98.1°F | Ht 70.0 in | Wt 191.5 lb

## 2019-06-02 DIAGNOSIS — K4090 Unilateral inguinal hernia, without obstruction or gangrene, not specified as recurrent: Secondary | ICD-10-CM | POA: Diagnosis not present

## 2019-06-02 DIAGNOSIS — K732 Chronic active hepatitis, not elsewhere classified: Secondary | ICD-10-CM | POA: Diagnosis not present

## 2019-06-02 HISTORY — DX: Unilateral inguinal hernia, without obstruction or gangrene, not specified as recurrent: K40.90

## 2019-06-02 NOTE — Assessment & Plan Note (Signed)
Will get labs. Doing well and tolerating treatment. Still has ~2 months left, would like to defer additional needs until he completes. Will get Hep A #2 after he is done with his covid series.

## 2019-06-02 NOTE — Progress Notes (Signed)
   Subjective:     Derrick York is a 64 y.o. male presenting for Follow-up and Immunizations (Hep A #2)     HPI  #Hernia - concerned developing of inguinal hernia - noticing bulge and it pushes it down - hx of having one in the past w/ mesh repair on the right side - now with symptoms on the left side  #Hep C - taking medication - will have some HA with medication - feels like his migraines - was getting dental pain - and getting upset stomach - just finished 28 days and has 56 days left   Review of Systems   Social History   Tobacco Use  Smoking Status Former Smoker  . Packs/day: 0.50  . Years: 5.00  . Pack years: 2.50  . Types: Cigarettes  . Quit date: 57  . Years since quitting: 41.2  Smokeless Tobacco Never Used        Objective:    BP Readings from Last 3 Encounters:  06/02/19 104/66  05/09/18 108/62  03/11/14 116/68   Wt Readings from Last 3 Encounters:  06/02/19 191 lb 8 oz (86.9 kg)  05/09/18 179 lb 4 oz (81.3 kg)  03/11/14 190 lb (86.2 kg)    BP 104/66   Pulse 76   Temp 98.1 F (36.7 C)   Ht 5\' 10"  (1.778 m)   Wt 191 lb 8 oz (86.9 kg)   SpO2 94%   BMI 27.48 kg/m    Physical Exam Exam conducted with a chaperone present.  Constitutional:      Appearance: Normal appearance. He is not ill-appearing or diaphoretic.  HENT:     Right Ear: External ear normal.     Left Ear: External ear normal.     Nose: Nose normal.  Eyes:     General: No scleral icterus.    Extraocular Movements: Extraocular movements intact.     Conjunctiva/sclera: Conjunctivae normal.  Cardiovascular:     Rate and Rhythm: Normal rate.  Pulmonary:     Effort: Pulmonary effort is normal.  Abdominal:     Hernia: A hernia is present. Hernia is present in the left inguinal area. There is no hernia in the right inguinal area.  Musculoskeletal:     Cervical back: Neck supple.  Skin:    General: Skin is warm and dry.  Neurological:     Mental Status: He is  alert. Mental status is at baseline.  Psychiatric:        Mood and Affect: Mood normal.           Assessment & Plan:   Problem List Items Addressed This Visit      Digestive   Chronic active hepatitis ([place])    Will get labs. Doing well and tolerating treatment. Still has ~2 months left, would like to defer additional needs until he completes. Will get Hep A #2 after he is done with his covid series.         Other   Left inguinal hernia - Primary    Hx of right repair with bulge on left on exam today. Has a doctor in mind he would like to go to, he will call back. Would like to defer treatment until May.       Relevant Orders   Ambulatory referral to General Surgery       Return in about 3 months (around 09/02/2019) for annual.  Lesleigh Noe, MD

## 2019-06-02 NOTE — Assessment & Plan Note (Signed)
Hx of right repair with bulge on left on exam today. Has a doctor in mind he would like to go to, he will call back. Would like to defer treatment until May.

## 2019-06-02 NOTE — Patient Instructions (Signed)
-   Return for nurse visit 2 weeks after completing our Covid Vaccine series  - Call back with the name of the surgeon you want to see

## 2019-06-16 ENCOUNTER — Ambulatory Visit: Payer: BC Managed Care – PPO | Admitting: Family Medicine

## 2019-08-15 ENCOUNTER — Ambulatory Visit: Payer: Self-pay | Admitting: Surgery

## 2019-08-15 NOTE — H&P (Signed)
Derrick York Appointment: 08/15/2019 11:20 AM Location: [facility] Patient #: [id number] DOB: 10/29/1955 Married / Language: Derrick York / Race: White Male  History of Present Illness [name] A. Kristell Wooding MD; 08/15/2019 12:16 PM) Patient words: Patient presents for evaluation of left inguinal hernia. He noticed a bulge about 8 months ago in his left groin. It pops in and pops out. It is better when he ices it and when he avoids any heavy lifting. History of right inguinal hernia repair about 4 years ago.  The patient is a 64 year old male.   Past Surgical History Lindwood Coke, RN; 08/15/2019 11:39 AM) Knee Surgery Right.  Allergies Lindwood Coke, RN; 08/15/2019 11:39 AM) No Known Drug Allergies [02/27/2014]: Allergies Reconciled  Medication History Lindwood Coke, RN; 08/15/2019 11:39 AM) No Current Medications Medications Reconciled  Social History Lindwood Coke, RN; 08/15/2019 11:39 AM) Alcohol use Remotely quit alcohol use. Caffeine use Tea. Illicit drug use Prefer to discuss with provider, Remotely quit drug use. Tobacco use Former smoker.  Family History Lindwood Coke, RN; 08/15/2019 11:39 AM) Alcohol Abuse Father, Mother. Cancer Mother. Heart Disease Father. Hypertension Mother. Respiratory Condition Mother.     Review of Systems (Diane Herrin RN; 08/15/2019 11:39 AM) General Not Present- Appetite Loss, Chills, Fatigue, Fever, Night Sweats, Weight Gain and Weight Loss. Skin Not Present- Change in Wart/Mole, Dryness, Hives, Jaundice, New Lesions, Non-Healing Wounds, Rash and Ulcer. HEENT Not Present- Earache, Hearing Loss, Hoarseness, Nose Bleed, Oral Ulcers, Ringing in the Ears, Seasonal Allergies, Sinus Pain, Sore Throat, Visual Disturbances, Wears glasses/contact lenses and Yellow Eyes. Respiratory Not Present- Bloody sputum, Chronic Cough, Difficulty Breathing, Snoring and Wheezing. Cardiovascular Not Present- Chest Pain, Difficulty Breathing  Lying Down, Leg Cramps, Palpitations, Rapid Heart Rate, Shortness of Breath and Swelling of Extremities. Gastrointestinal Not Present- Abdominal Pain, Bloating, Bloody Stool, Change in Bowel Habits, Chronic diarrhea, Constipation, Difficulty Swallowing, Excessive gas, Gets full quickly at meals, Hemorrhoids, Indigestion, Nausea, Rectal Pain and Vomiting. Male Genitourinary Not Present- Blood in Urine, Change in Urinary Stream, Frequency, Impotence, Nocturia, Painful Urination, Urgency and Urine Leakage. Musculoskeletal Present- Joint Stiffness. Not Present- Back Pain, Joint Pain, Muscle Pain, Muscle Weakness and Swelling of Extremities. Psychiatric Not Present- Anxiety, Bipolar, Change in Sleep Pattern, Depression, Fearful and Frequent crying. Endocrine Not Present- Cold Intolerance, Excessive Hunger, Hair Changes, Heat Intolerance, Hot flashes and New Diabetes. Hematology Not Present- Blood Thinners, Easy Bruising, Excessive bleeding, Gland problems, HIV and Persistent Infections.  Vitals (Diane Herrin RN; 08/15/2019 11:40 AM) 08/15/2019 11:39 AM Weight: 187.25 lb Height: 72in Body Surface Area: 2.07 m Body Mass Index: 25.4 kg/m  Temp.: 98.40F  Pulse: 94 (Regular)  P.OX: 98% (Room air) BP: 122/70(Sitting, Left Arm, Standard)        Physical Exam (Arva Slaugh A. Indira Sorenson MD; 08/15/2019 12:16 PM)  Chest and Lung Exam Chest and lung exam reveals -quiet, even and easy respiratory effort with no use of accessory muscles and on auscultation, normal breath sounds, no adventitious sounds and normal vocal resonance. Inspection Chest Wall - Normal. Back - normal.  Cardiovascular Cardiovascular examination reveals -normal heart sounds, regular rate and rhythm with no murmurs and normal pedal pulses bilaterally.  Abdomen Note: Reducible left inguinal hernia. Moderate size Scarred right groin noted.  Neurologic Neurologic evaluation reveals -alert and oriented x 3 with no  impairment of recent or remote memory. Mental Status-Normal.  Musculoskeletal Normal Exam - Left-Upper Extremity Strength Normal and Lower Extremity Strength Normal. Normal Exam - Right-Upper Extremity Strength Normal and Lower Extremity Strength  Normal.    Assessment & Plan (Renezmae Canlas A. Lexiana Spindel MD; 08/15/2019 12:17 PM)  LEFT INGUINAL HERNIA (K40.90) Impression: Discussed repair of left inguinal hernia with mesh. The risk of hernia repair include bleeding, infection, organ injury, bowel injury, bladder injury, nerve injury recurrent hernia, blood clots, worsening of underlying condition, chronic pain, mesh use, open surgery, death, and the need for other operations. Pt agrees to proceed  Current Plans You are being scheduled for surgery- Our schedulers will call you.  You should hear from our office's scheduling department within 5 working days about the location, date, and time of surgery. We try to make accommodations for patient's preferences in scheduling surgery, but sometimes the OR schedule or the surgeon's schedule prevents [name] from making those accommodations.  If you have not heard from our office [phone]) in 5 working days, call the office and ask for your surgeon's nurse.  If you have other questions about your diagnosis, plan, or surgery, call the office and ask for your surgeon's nurse.  Pt Education - Pamphlet Given - Hernia Surgery: discussed with patient and provided information. The anatomy & physiology of the abdominal wall and pelvic floor was discussed. The pathophysiology of hernias in the inguinal and pelvic region was discussed. Natural history risks such as progressive enlargement, pain, incarceration, and strangulation was discussed. Contributors to complications such as smoking, obesity, diabetes, prior surgery, etc were discussed.  I feel the risks of no intervention will lead to serious problems that outweigh the operative risks; therefore, I  recommended surgery to reduce and repair the hernia. I explaineD an open approach. I noted usual use of mesh to patch and/or buttress hernia repair  Risks such as bleeding, infection, abscess, need for further treatment, heart attack, death, and other risks were discussed. I noted a good likelihood this will help address the problem. Goals of post-operative recovery were discussed as well. Possibility that this will not correct all symptoms was explained. I stressed the importance of low-impact activity, aggressive pain control, avoiding constipation, & not pushing through pain to minimize risk of post-operative chronic pain or injury. Possibility of reherniation was discussed. We will work to minimize complications.  An educational handout further explaining the pathology & treatment options was given as well. Questions were answered. The patient expresses understanding & wishes to proceed with surgery.  Pt Education - CCS Mesh education: discussed with patient and provided information.

## 2019-10-29 ENCOUNTER — Encounter: Payer: Self-pay | Admitting: Family Medicine

## 2019-10-29 DIAGNOSIS — K7400 Hepatic fibrosis, unspecified: Secondary | ICD-10-CM | POA: Insufficient documentation

## 2019-10-29 DIAGNOSIS — Z8619 Personal history of other infectious and parasitic diseases: Secondary | ICD-10-CM | POA: Insufficient documentation

## 2019-12-25 ENCOUNTER — Encounter: Payer: Self-pay | Admitting: Family Medicine

## 2019-12-25 ENCOUNTER — Other Ambulatory Visit: Payer: BC Managed Care – PPO

## 2019-12-25 ENCOUNTER — Telehealth (INDEPENDENT_AMBULATORY_CARE_PROVIDER_SITE_OTHER): Payer: BC Managed Care – PPO | Admitting: Family Medicine

## 2019-12-25 ENCOUNTER — Other Ambulatory Visit: Payer: Self-pay

## 2019-12-25 DIAGNOSIS — J069 Acute upper respiratory infection, unspecified: Secondary | ICD-10-CM | POA: Diagnosis not present

## 2019-12-25 NOTE — Progress Notes (Signed)
I connected with Derrick York on 12/25/19 at 12:00 PM EDT by video and verified that I am speaking with the correct person using two identifiers.   I discussed the limitations, risks, security and privacy concerns of performing an evaluation and management service by video and the availability of in person appointments. I also discussed with the patient that there may be a patient responsible charge related to this service. The patient expressed understanding and agreed to proceed.  Patient location: Home Provider Location: [place] Participants: Derrick York and Derrick York   Subjective:     Derrick York is a 64 y.o. male presenting for Cough (dry x 6 days )     Cough This is a new problem. The current episode started in the past 7 days. The problem has been gradually improving. The cough is non-productive. Associated symptoms include nasal congestion and postnasal drip. Pertinent negatives include no chills, fever, headaches, sore throat or shortness of breath. Exacerbated by: staying up late. He has tried OTC cough suppressant (juice) for the symptoms. The treatment provided no relief.   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  HENT: Positive for postnasal drip. Negative for sore throat.   Respiratory: Positive for cough. Negative for shortness of breath.   Neurological: Negative for headaches.     Social History   Tobacco Use  Smoking Status Former Smoker  . Packs/day: 0.50  . Years: 5.00  . Pack years: 2.50  . Types: Cigarettes  . Quit date: 68  . Years since quitting: 41.7  Smokeless Tobacco Never Used        Objective:   BP Readings from Last 3 Encounters:  06/02/19 104/66  05/09/18 108/62  03/11/14 116/68   Wt Readings from  Last 3 Encounters:  06/02/19 191 lb 8 oz (86.9 kg)  05/09/18 179 lb 4 oz (81.3 kg)  03/11/14 190 lb (86.2 kg)   There were no vitals taken for this visit.   Physical Exam Constitutional:      Appearance: Normal appearance. He is not ill-appearing.  HENT:     Head: Normocephalic and atraumatic.     Right Ear: External ear normal.     Left Ear: External ear normal.  Eyes:     Conjunctiva/sclera: Conjunctivae normal.  Pulmonary:     Effort: Pulmonary effort is normal. No respiratory distress.  Neurological:     Mental Status: He is alert. Mental status is at baseline.  Psychiatric:        Mood and Affect: Mood normal.        Behavior: Behavior normal.        Thought Content: Thought content normal.        Judgment: Judgment normal.             Assessment & Plan:   Problem List Items Addressed This Visit    None    Visit Diagnoses    Viral URI with cough    -  Primary     Home covid test negative and previously vaccinated.  Offered car side testing but given improvement in symptoms and negative test not necessary.   Advised allergy pill and continue home management Return if worsening or new symptoms  No follow-ups on file.  Derrick York  Derrick Soda, MD

## 2020-01-30 ENCOUNTER — Ambulatory Visit: Payer: Self-pay | Admitting: Surgery

## 2020-01-30 NOTE — H&P (Signed)
Derrick York Appointment: 01/30/2020 10:30 AM Location: [facility] Patient #: [id number] DOB: 01-May-1955 Married / Language: English / Race: White Male  History of Present Illness Derrick York A. Derrick Fullam MD; 01/30/2020 11:10 AM) Patient words: Patient returns for follow-up of his left inguinal hernia. He has postponed surgery and is eager to schedule before the end of the year. His left groin swelling is of stable he wears a truss to help with that. He denies nausea vomiting or difficulty voiding at this point in time. No change to his bowel or bladder function.  The patient is a 64 year old male.   Allergies (Derrick York, CMA; 01/30/2020 10:23 AM) No Known Drug Allergies [02/27/2014]: Allergies Reconciled  Medication History (Derrick York, [place]; 01/30/2020 10:23 AM) No Current Medications Medications Reconciled    Vitals (Derrick York CMA; 01/30/2020 10:24 AM) 01/30/2020 10:24 AM Weight: 188.38 lb Height: 72in Body Surface Area: 2.08 m Body Mass Index: 25.55 kg/m  Temp.: 97.37F  Pulse: 85 (Regular)  BP: 122/72(Sitting, Left Arm, Standard)        Physical Exam (Derrick York A. Derrick Vazquez MD; 01/30/2020 11:11 AM)  General Mental Status-Alert. General Appearance-Consistent with stated age. Hydration-Well hydrated. Voice-Normal.  Head and Neck Head-normocephalic, atraumatic with no lesions or palpable masses. Trachea-midline. Thyroid Gland Characteristics - normal size and consistency.  Chest and Lung Exam Chest and lung exam reveals -quiet, even and easy respiratory effort with no use of accessory muscles and on auscultation, normal breath sounds, no adventitious sounds and normal vocal resonance. Inspection Chest Wall - Normal. Back - normal.  Cardiovascular Cardiovascular examination reveals -normal heart sounds, regular rate and rhythm with no murmurs and normal pedal pulses bilaterally.  Abdomen Note: Moderate size  reducible left inguinal hernia. No evidence of recurrent right inguinal hernia. Soft nontender.    Assessment & Plan (Derrick York A. Derrick Skirvin MD; 01/30/2020 10:38 AM)  LEFT INGUINAL HERNIA (K40.90) Impression: Discussed repair of left inguinal hernia with mesh. The risk of hernia repair include bleeding, infection, organ injury, bowel injury, bladder injury, nerve injury recurrent hernia, blood clots, worsening of underlying condition, chronic pain, mesh use, open surgery, death, and the need for other operations. Pt agrees to proceed  Current Plans You are being scheduled for surgery- Our schedulers will call you.  You should hear from our office's scheduling department within 5 working days about the location, date, and time of surgery. We try to make accommodations for patient's preferences in scheduling surgery, but sometimes the OR schedule or the surgeon's schedule prevents [name] from making those accommodations.  If you have not heard from our office [phone]) in 5 working days, call the office and ask for your surgeon's nurse.  If you have other questions about your diagnosis, plan, or surgery, call the office and ask for your surgeon's nurse.  Pt Education - Pamphlet Given - Hernia Surgery: discussed with patient and provided information. Pt Education - CCS Mesh education: discussed with patient and provided information.

## 2020-02-09 ENCOUNTER — Encounter: Payer: Self-pay | Admitting: Family Medicine

## 2020-02-09 ENCOUNTER — Other Ambulatory Visit: Payer: Self-pay

## 2020-02-09 ENCOUNTER — Ambulatory Visit: Payer: BC Managed Care – PPO | Admitting: Family Medicine

## 2020-02-09 ENCOUNTER — Ambulatory Visit (INDEPENDENT_AMBULATORY_CARE_PROVIDER_SITE_OTHER)
Admission: RE | Admit: 2020-02-09 | Discharge: 2020-02-09 | Disposition: A | Discharge status: Home / Self Care | Payer: BC Managed Care – PPO | Source: Ambulatory Visit | Attending: Family Medicine | Admitting: Family Medicine

## 2020-02-09 VITALS — BP 92/68 | HR 70 | Temp 98.3°F | Ht 73.0 in | Wt 192.2 lb

## 2020-02-09 DIAGNOSIS — S52124A Nondisplaced fracture of head of right radius, initial encounter for closed fracture: Secondary | ICD-10-CM

## 2020-02-09 DIAGNOSIS — M25521 Pain in right elbow: Secondary | ICD-10-CM

## 2020-02-09 DIAGNOSIS — W19XXXA Unspecified fall, initial encounter: Secondary | ICD-10-CM | POA: Diagnosis not present

## 2020-02-09 DIAGNOSIS — M25531 Pain in right wrist: Secondary | ICD-10-CM

## 2020-02-09 NOTE — Progress Notes (Signed)
Elena Cothern T. Malan Werk, MD, [facility] [place], [address]  Phone: [phone]  FAX: [phone]  Jaquin Coy Yakel - 64 y.o. male  MRN [id number]  Date of Birth: Nov 03, 1955  Date: 02/09/2020  PCP: Lesleigh Noe, MD  Referral: Lesleigh Noe, MD  Chief Complaint  Patient presents with  . Fall    Last night  . Elbow Pain    Right    This visit occurred during the SARS-CoV-2 public health emergency.  Safety protocols were in place, including screening questions prior to the visit, additional usage of staff PPE, and extensive cleaning of exam room while observing appropriate contact time as indicated for disinfecting solutions.   Subjective:   Sem Mccaughey Crace is a 64 y.o. very pleasant male patient with Body mass index is 25.36 kg/m. who presents with the following:  Date of injury: February 08, 2020.  Patient had an accident last night and he fell and he has acute elbow pain right now.  He is wearing a sling in the office.  [place] and fell on his forearms, mostly on the R side.  He presents with some very significant elbow pain on the right.  Does have pain with rotational movements and he is guarding quite a bit while wearing his sling.  Does have some mild bruising at the wrist on the volar aspect bilaterally.  Does not have any other significant pain that he can recall.  He does not have any shoulder pain, neck pain, chest wall pain, hand and wrist pain.  No finger pain.  Review of Systems is noted in the HPI, as appropriate   Objective:   BP 92/68   Pulse 70   Temp 98.3 F (36.8 C) (Temporal)   Ht 6\' 1"  (1.854 m)   Wt 192 lb 4 oz (87.2 kg)   SpO2 98%   BMI 25.36 kg/m    GEN: No acute distress; alert,appropriate. PULM: Breathing comfortably in no respiratory distress PSYCH: Normally interactive.    On the left side the patient is entirely  tender throughout the entirety wrist, hand, forearm, elbow, humerus, as well as shoulder.  On the right side the patient is nontender along all digits, hand, wrist, as well as the distal forearm.  At the proximal radius and ulna patient does have some pain with compression. No pain with palpation along the clavicle, or at the humeral head and throughout the humerus until it becomes distal.  Does have pain at the radial head and he does have pain with virtually any movement at the elbow on the right.  Radiology: DG Elbow Complete Right  Result Date: 02/09/2020 CLINICAL DATA:  Right elbow pain. The patient suffered an unspecified injury. EXAM: RIGHT ELBOW - COMPLETE 3+ VIEW COMPARISON:  None. FINDINGS: The patient has a large elbow joint effusion. A subtle, nondisplaced fracture of the radial head is identified. No other acute or focal bony abnormality. IMPRESSION: Subtle, nondisplaced fracture of the right radial head with an associated elbow joint effusion. Electronically Signed   By: Inge Rise M.D.   On: 02/09/2020 12:36   DG Forearm Right  Result Date: 02/09/2020 CLINICAL DATA:  Right forearm pain after an unspecified injury. EXAM: RIGHT FOREARM - 2 VIEW COMPARISON:  None. FINDINGS: Nondisplaced fracture of the radial head is identified as seen on dedicated plain films of the elbow. No other abnormality. IMPRESSION: Nondisplaced fracture  right radial head.  Otherwise negative. Electronically Signed   By: Inge Rise M.D.   On: 02/09/2020 12:36     Assessment and Plan:     ICD-10-CM   1. Closed nondisplaced fracture of head of right radius, initial encounter  S52.124A Ambulatory referral to Orthopedic Surgery  2. Elbow pain, right  M25.521 DG Elbow Complete Right  3. Forearm joint pain, right  M25.531 DG Forearm Right  4. Fall, initial encounter  W19.XXXA DG Elbow Complete Right    DG Forearm Right    Ambulatory referral to Orthopedic Surgery   Total encounter time: 30  minutes. This includes total time spent on the day of encounter.  Anatomical review.  Reviewed plan of care. Nondisplaced radial head fracture.  Patient needs to keep arm and elbow in sling.  With this particular fracture, would like for him to see orthopedics in follow-up.  We will help to arrange that from our office.  No orders of the defined types were placed in this encounter.  There are no discontinued medications. Orders Placed This Encounter  Procedures  . DG Elbow Complete Right  . DG Forearm Right  . Ambulatory referral to Orthopedic Surgery    Follow-up: Return in about 1 week (around 02/16/2020).  Signed,  Maud Deed. Devian Bartolomei, MD   Outpatient Encounter Medications as of 02/09/2020  Medication Sig  . magnesium 30 MG tablet Take 30 mg by mouth daily.  . Multiple Vitamins-Minerals (MENS MULTIVITAMIN PO) Take by mouth daily.  . Zinc 30 MG TABS Take by mouth daily.   No facility-administered encounter medications on file as of 02/09/2020.

## 2020-02-10 DIAGNOSIS — S52134A Nondisplaced fracture of neck of right radius, initial encounter for closed fracture: Secondary | ICD-10-CM | POA: Diagnosis not present

## 2020-02-24 ENCOUNTER — Encounter (HOSPITAL_BASED_OUTPATIENT_CLINIC_OR_DEPARTMENT_OTHER): Payer: Self-pay | Admitting: Surgery

## 2020-02-27 ENCOUNTER — Encounter (HOSPITAL_BASED_OUTPATIENT_CLINIC_OR_DEPARTMENT_OTHER)
Admission: RE | Admit: 2020-02-27 | Discharge: 2020-02-27 | Disposition: A | Discharge status: Home / Self Care | Payer: BC Managed Care – PPO | Source: Ambulatory Visit | Attending: Surgery | Admitting: Surgery

## 2020-02-27 ENCOUNTER — Other Ambulatory Visit (HOSPITAL_COMMUNITY)
Admission: RE | Admit: 2020-02-27 | Discharge: 2020-02-27 | Disposition: A | Discharge status: Home / Self Care | Payer: BC Managed Care – PPO | Source: Ambulatory Visit | Attending: Surgery | Admitting: Surgery

## 2020-02-27 DIAGNOSIS — Z01812 Encounter for preprocedural laboratory examination: Secondary | ICD-10-CM | POA: Insufficient documentation

## 2020-02-27 DIAGNOSIS — Z20822 Contact with and (suspected) exposure to covid-19: Secondary | ICD-10-CM | POA: Insufficient documentation

## 2020-02-27 DIAGNOSIS — S52134A Nondisplaced fracture of neck of right radius, initial encounter for closed fracture: Secondary | ICD-10-CM | POA: Diagnosis not present

## 2020-02-27 LAB — CBC WITH DIFFERENTIAL/PLATELET
Abs Immature Granulocytes: 0.01 10*3/uL (ref 0.00–0.07)
Basophils Absolute: 0 10*3/uL (ref 0.0–0.1)
Basophils Relative: 1 %
Eosinophils Absolute: 0.2 10*3/uL (ref 0.0–0.5)
Eosinophils Relative: 3 %
HCT: 43 % (ref 39.0–52.0)
Hemoglobin: 15.2 g/dL (ref 13.0–17.0)
Immature Granulocytes: 0 %
Lymphocytes Relative: 35 %
Lymphs Abs: 2.1 10*3/uL (ref 0.7–4.0)
MCH: 31 pg (ref 26.0–34.0)
MCHC: 35.3 g/dL (ref 30.0–36.0)
MCV: 87.6 fL (ref 80.0–100.0)
Monocytes Absolute: 0.5 10*3/uL (ref 0.1–1.0)
Monocytes Relative: 8 %
Neutro Abs: 3.2 10*3/uL (ref 1.7–7.7)
Neutrophils Relative %: 53 %
Platelets: 159 10*3/uL (ref 150–400)
RBC: 4.91 MIL/uL (ref 4.22–5.81)
RDW: 12.2 % (ref 11.5–15.5)
WBC: 6.1 10*3/uL (ref 4.0–10.5)
nRBC: 0 % (ref 0.0–0.2)

## 2020-02-27 LAB — COMPREHENSIVE METABOLIC PANEL
ALT: 25 U/L (ref 0–44)
AST: 31 U/L (ref 15–41)
Albumin: 3.9 g/dL (ref 3.5–5.0)
Alkaline Phosphatase: 59 U/L (ref 38–126)
Anion gap: 10 (ref 5–15)
BUN: 14 mg/dL (ref 8–23)
CO2: 24 mmol/L (ref 22–32)
Calcium: 9.5 mg/dL (ref 8.9–10.3)
Chloride: 102 mmol/L (ref 98–111)
Creatinine, Ser: 0.72 mg/dL (ref 0.61–1.24)
GFR, Estimated: >60 mL/min (ref >60)
Glucose, Bld: 106 mg/dL — ABNORMAL HIGH (ref 70–99)
Potassium: 4.8 mmol/L (ref 3.5–5.1)
Sodium: 136 mmol/L (ref 135–145)
Total Bilirubin: 1.2 mg/dL (ref 0.3–1.2)
Total Protein: 7.1 g/dL (ref 6.5–8.1)

## 2020-02-27 LAB — SARS CORONAVIRUS 2 (TAT 6-24 HRS): SARS Coronavirus 2: NEGATIVE

## 2020-02-27 MED ORDER — CHLORHEXIDINE GLUCONATE CLOTH 2 % EX PADS: 6.0000 | MEDICATED_PAD | Freq: Once | CUTANEOUS | Status: DC

## 2020-02-27 NOTE — Progress Notes (Signed)
      Enhanced Recovery after Surgery for Orthopedics Enhanced Recovery after Surgery is a protocol used to improve the stress on your body and your recovery after surgery.  Patient Instructions  . The night before surgery:  o No food after midnight. ONLY clear liquids after midnight  . The day of surgery (if you do NOT have diabetes):  o Drink ONE (1) Pre-Surgery Clear Ensure as directed.   o This drink was given to you during your hospital  pre-op appointment visit. o The pre-op nurse will instruct you on the time to drink the  Pre-Surgery Ensure depending on your surgery time. o Finish the drink at the designated time by the pre-op nurse.  o Nothing else to drink after completing the  Pre-Surgery Clear Ensure.  . The day of surgery (if you have diabetes): o Drink ONE (1) Gatorade 2 (G2) as directed. o This drink was given to you during your hospital  pre-op appointment visit.  o The pre-op nurse will instruct you on the time to drink the   Gatorade 2 (G2) depending on your surgery time. o Color of the Gatorade may vary. Red is not allowed. o Nothing else to drink after completing the  Gatorade 2 (G2).         If you have questions, please contact your surgeon's office.  Patient provided soap instructions and verbalized understanding.

## 2020-03-02 ENCOUNTER — Ambulatory Visit (HOSPITAL_BASED_OUTPATIENT_CLINIC_OR_DEPARTMENT_OTHER)
Admission: RE | Admit: 2020-03-02 | Discharge: 2020-03-02 | Disposition: A | Discharge status: Home / Self Care | Payer: BC Managed Care – PPO | Attending: Surgery | Admitting: Surgery

## 2020-03-02 ENCOUNTER — Other Ambulatory Visit: Payer: Self-pay

## 2020-03-02 ENCOUNTER — Ambulatory Visit (HOSPITAL_BASED_OUTPATIENT_CLINIC_OR_DEPARTMENT_OTHER): Payer: BC Managed Care – PPO | Admitting: Certified Registered"

## 2020-03-02 ENCOUNTER — Encounter (HOSPITAL_BASED_OUTPATIENT_CLINIC_OR_DEPARTMENT_OTHER)
Admission: RE | Disposition: A | Discharge status: Home / Self Care | Payer: Self-pay | Source: Home / Self Care | Attending: Surgery

## 2020-03-02 ENCOUNTER — Encounter (HOSPITAL_BASED_OUTPATIENT_CLINIC_OR_DEPARTMENT_OTHER): Payer: Self-pay | Admitting: Surgery

## 2020-03-02 DIAGNOSIS — Z87891 Personal history of nicotine dependence: Secondary | ICD-10-CM | POA: Insufficient documentation

## 2020-03-02 DIAGNOSIS — K7400 Hepatic fibrosis, unspecified: Secondary | ICD-10-CM | POA: Diagnosis not present

## 2020-03-02 DIAGNOSIS — K4090 Unilateral inguinal hernia, without obstruction or gangrene, not specified as recurrent: Secondary | ICD-10-CM | POA: Diagnosis not present

## 2020-03-02 DIAGNOSIS — G8918 Other acute postprocedural pain: Secondary | ICD-10-CM | POA: Diagnosis not present

## 2020-03-02 HISTORY — PX: INSERTION OF MESH: SHX5868

## 2020-03-02 HISTORY — PX: INGUINAL HERNIA REPAIR: SHX194

## 2020-03-02 SURGERY — REPAIR, HERNIA, INGUINAL, ADULT
Anesthesia: Regional | Site: Groin | Laterality: Left

## 2020-03-02 MED ORDER — OXYCODONE HCL 5 MG PO TABS: 5.0000 mg | ORAL_TABLET | Freq: Once | ORAL | Status: DC | PRN

## 2020-03-02 MED ORDER — PROPOFOL 10 MG/ML IV BOLUS
INTRAVENOUS | Status: DC | PRN
  Administered 2020-03-02: 150 mg via INTRAVENOUS

## 2020-03-02 MED ORDER — ROCURONIUM BROMIDE 10 MG/ML (PF) SYRINGE
PREFILLED_SYRINGE | INTRAVENOUS | Status: AC
  Filled 2020-03-02: qty 10

## 2020-03-02 MED ORDER — ACETAMINOPHEN 500 MG PO TABS: 1000.0000 mg | ORAL_TABLET | Freq: Once | ORAL | Status: DC

## 2020-03-02 MED ORDER — ONDANSETRON HCL 4 MG/2ML IJ SOLN
INTRAMUSCULAR | Status: AC
  Filled 2020-03-02: qty 2

## 2020-03-02 MED ORDER — SUGAMMADEX SODIUM 200 MG/2ML IV SOLN
INTRAVENOUS | Status: DC | PRN
  Administered 2020-03-02: 200 mg via INTRAVENOUS

## 2020-03-02 MED ORDER — CEFAZOLIN SODIUM-DEXTROSE 2-4 GM/100ML-% IV SOLN
INTRAVENOUS | Status: AC
  Filled 2020-03-02: qty 100

## 2020-03-02 MED ORDER — GABAPENTIN 300 MG PO CAPS
300.0000 mg | ORAL_CAPSULE | ORAL | Status: AC
  Administered 2020-03-02: 11:00:00 300 mg via ORAL

## 2020-03-02 MED ORDER — FENTANYL CITRATE (PF) 100 MCG/2ML IJ SOLN
INTRAMUSCULAR | Status: AC
  Filled 2020-03-02: qty 2

## 2020-03-02 MED ORDER — DEXAMETHASONE SODIUM PHOSPHATE 10 MG/ML IJ SOLN
INTRAMUSCULAR | Status: AC
  Filled 2020-03-02: qty 1

## 2020-03-02 MED ORDER — ACETAMINOPHEN 500 MG PO TABS
1000.0000 mg | ORAL_TABLET | ORAL | Status: AC
  Administered 2020-03-02: 11:00:00 1000 mg via ORAL

## 2020-03-02 MED ORDER — LACTATED RINGERS IV SOLN: INTRAVENOUS | Status: DC

## 2020-03-02 MED ORDER — LIDOCAINE HCL (CARDIAC) PF 100 MG/5ML IV SOSY
PREFILLED_SYRINGE | INTRAVENOUS | Status: DC | PRN
  Administered 2020-03-02: 30 mg via INTRAVENOUS

## 2020-03-02 MED ORDER — DEXAMETHASONE SODIUM PHOSPHATE 4 MG/ML IJ SOLN
INTRAMUSCULAR | Status: DC | PRN
  Administered 2020-03-02: 10 mg via INTRAVENOUS

## 2020-03-02 MED ORDER — EPHEDRINE SULFATE 50 MG/ML IJ SOLN
INTRAMUSCULAR | Status: DC | PRN
  Administered 2020-03-02 (×2): 10 mg via INTRAVENOUS

## 2020-03-02 MED ORDER — GABAPENTIN 300 MG PO CAPS
ORAL_CAPSULE | ORAL | Status: AC
  Filled 2020-03-02: qty 1

## 2020-03-02 MED ORDER — MIDAZOLAM HCL 2 MG/2ML IJ SOLN
INTRAMUSCULAR | Status: AC
  Filled 2020-03-02: qty 2

## 2020-03-02 MED ORDER — PROPOFOL 10 MG/ML IV BOLUS
INTRAVENOUS | Status: AC
  Filled 2020-03-02: qty 20

## 2020-03-02 MED ORDER — CEFAZOLIN SODIUM-DEXTROSE 2-3 GM-%(50ML) IV SOLR
INTRAVENOUS | Status: DC | PRN
  Administered 2020-03-02: 2 g via INTRAVENOUS

## 2020-03-02 MED ORDER — LIDOCAINE 2% (20 MG/ML) 5 ML SYRINGE
INTRAMUSCULAR | Status: AC
  Filled 2020-03-02: qty 5

## 2020-03-02 MED ORDER — CELECOXIB 200 MG PO CAPS
ORAL_CAPSULE | ORAL | Status: AC
  Filled 2020-03-02: qty 1

## 2020-03-02 MED ORDER — HYDROMORPHONE HCL 1 MG/ML IJ SOLN: 0.2500 mg | INTRAMUSCULAR | Status: DC | PRN

## 2020-03-02 MED ORDER — ACETAMINOPHEN 500 MG PO TABS
ORAL_TABLET | ORAL | Status: AC
  Filled 2020-03-02: qty 2

## 2020-03-02 MED ORDER — CELECOXIB 200 MG PO CAPS
200.0000 mg | ORAL_CAPSULE | ORAL | Status: AC
  Administered 2020-03-02: 11:00:00 200 mg via ORAL

## 2020-03-02 MED ORDER — ROCURONIUM BROMIDE 100 MG/10ML IV SOLN
INTRAVENOUS | Status: DC | PRN
  Administered 2020-03-02: 50 mg via INTRAVENOUS
  Administered 2020-03-02: 10 mg via INTRAVENOUS

## 2020-03-02 MED ORDER — ONDANSETRON HCL 4 MG/2ML IJ SOLN: 4.0000 mg | Freq: Once | INTRAMUSCULAR | Status: DC | PRN

## 2020-03-02 MED ORDER — BUPIVACAINE HCL (PF) 0.5 % IJ SOLN
INTRAMUSCULAR | Status: DC | PRN
  Administered 2020-03-02: 20 mL via PERINEURAL

## 2020-03-02 MED ORDER — BUPIVACAINE-EPINEPHRINE 0.25% -1:200000 IJ SOLN
INTRAMUSCULAR | Status: DC | PRN
  Administered 2020-03-02: 10 mL

## 2020-03-02 MED ORDER — ONDANSETRON HCL 4 MG/2ML IJ SOLN
INTRAMUSCULAR | Status: DC | PRN
Start: 2020-03-02 — End: 2020-03-02
  Administered 2020-03-02: 4 mg via INTRAVENOUS

## 2020-03-02 MED ORDER — OXYCODONE HCL 5 MG PO TABS: 5.0000 mg | ORAL_TABLET | Freq: Four times a day (QID) | ORAL | 0 refills | Status: DC | PRN

## 2020-03-02 MED ORDER — ONDANSETRON HCL 4 MG PO TABS: 4.0000 mg | ORAL_TABLET | Freq: Every day | ORAL | 1 refills | Status: AC | PRN

## 2020-03-02 MED ORDER — FENTANYL CITRATE (PF) 100 MCG/2ML IJ SOLN
100.0000 ug | Freq: Once | INTRAMUSCULAR | Status: AC
  Administered 2020-03-02: 12:00:00 100 ug via INTRAVENOUS

## 2020-03-02 MED ORDER — IBUPROFEN 800 MG PO TABS: 800.0000 mg | ORAL_TABLET | Freq: Three times a day (TID) | ORAL | 0 refills | Status: DC | PRN

## 2020-03-02 MED ORDER — MIDAZOLAM HCL 2 MG/2ML IJ SOLN
2.0000 mg | Freq: Once | INTRAMUSCULAR | Status: AC
  Administered 2020-03-02: 12:00:00 2 mg via INTRAVENOUS

## 2020-03-02 MED ORDER — FENTANYL CITRATE (PF) 100 MCG/2ML IJ SOLN
INTRAMUSCULAR | Status: DC | PRN
  Administered 2020-03-02 (×2): 25 ug via INTRAVENOUS
  Administered 2020-03-02: 50 ug via INTRAVENOUS

## 2020-03-02 MED ORDER — DEXTROSE 5 % IV SOLN: 3.0000 g | INTRAVENOUS | Status: DC

## 2020-03-02 MED ORDER — OXYCODONE HCL 5 MG/5ML PO SOLN: 5.0000 mg | Freq: Once | ORAL | Status: DC | PRN

## 2020-03-02 MED ORDER — BUPIVACAINE LIPOSOME 1.3 % IJ SUSP
INTRAMUSCULAR | Status: DC | PRN
  Administered 2020-03-02: 10 mL via PERINEURAL

## 2020-03-02 MED ORDER — KETOROLAC TROMETHAMINE 30 MG/ML IJ SOLN: 30.0000 mg | Freq: Once | INTRAMUSCULAR | Status: DC | PRN

## 2020-03-02 SURGICAL SUPPLY — 50 items
BLADE CLIPPER SURG (BLADE) ×2 IMPLANT
BLADE SURG 15 STRL LF DISP TIS (BLADE) ×1 IMPLANT
BLADE SURG 15 STRL SS (BLADE) ×1
CANISTER SUCT 1200ML W/VALVE (MISCELLANEOUS) IMPLANT
CHLORAPREP W/TINT 26 (MISCELLANEOUS) ×2 IMPLANT
COVER BACK TABLE 60X90IN (DRAPES) ×2 IMPLANT
COVER MAYO STAND STRL (DRAPES) ×2 IMPLANT
COVER WAND RF STERILE (DRAPES) IMPLANT
DECANTER SPIKE VIAL GLASS SM (MISCELLANEOUS) IMPLANT
DERMABOND ADVANCED (GAUZE/BANDAGES/DRESSINGS) ×1
DERMABOND ADVANCED .7 DNX12 (GAUZE/BANDAGES/DRESSINGS) ×1 IMPLANT
DRAIN PENROSE 1/2X12 LTX STRL (WOUND CARE) ×2 IMPLANT
DRAPE LAPAROTOMY TRNSV 102X78 (DRAPES) ×2 IMPLANT
DRAPE UTILITY XL STRL (DRAPES) ×2 IMPLANT
ELECT COATED BLADE 2.86 ST (ELECTRODE) ×2 IMPLANT
ELECT REM PT RETURN 9FT ADLT (ELECTROSURGICAL) ×2
ELECTRODE REM PT RTRN 9FT ADLT (ELECTROSURGICAL) ×1 IMPLANT
GAUZE 4X4 16PLY RFD (DISPOSABLE) IMPLANT
GAUZE SPONGE 4X4 12PLY STRL LF (GAUZE/BANDAGES/DRESSINGS) IMPLANT
GLOVE ECLIPSE 6.5 STRL STRAW (GLOVE) ×2 IMPLANT
GLOVE ECLIPSE 8.0 STRL XLNG CF (GLOVE) ×2 IMPLANT
GLOVE SRG 8 PF TXTR STRL LF DI (GLOVE) ×1 IMPLANT
GLOVE SURG UNDER POLY LF SZ6.5 (GLOVE) ×4 IMPLANT
GLOVE SURG UNDER POLY LF SZ8 (GLOVE) ×2
GOWN STRL REUS W/ TWL LRG LVL3 (GOWN DISPOSABLE) ×1 IMPLANT
GOWN STRL REUS W/ TWL XL LVL3 (GOWN DISPOSABLE) ×1 IMPLANT
GOWN STRL REUS W/TWL LRG LVL3 (GOWN DISPOSABLE) ×1
GOWN STRL REUS W/TWL XL LVL3 (GOWN DISPOSABLE) ×1
MESH HERNIA SYS ULTRAPRO LRG (Mesh General) ×2 IMPLANT
NEEDLE HYPO 25X1 1.5 SAFETY (NEEDLE) ×2 IMPLANT
NS IRRIG 1000ML POUR BTL (IV SOLUTION) ×2 IMPLANT
PACK BASIN DAY SURGERY FS (CUSTOM PROCEDURE TRAY) ×2 IMPLANT
PENCIL SMOKE EVACUATOR (MISCELLANEOUS) ×2 IMPLANT
SLEEVE SCD COMPRESS KNEE MED (MISCELLANEOUS) ×2 IMPLANT
SPONGE LAP 4X18 RFD (DISPOSABLE) ×2 IMPLANT
STRIP CLOSURE SKIN 1/2X4 (GAUZE/BANDAGES/DRESSINGS) IMPLANT
SUT MON AB 4-0 PC3 18 (SUTURE) ×2 IMPLANT
SUT NOVA 0 T19/GS 22DT (SUTURE) IMPLANT
SUT NOVA NAB DX-16 0-1 5-0 T12 (SUTURE) ×4 IMPLANT
SUT VIC AB 2-0 SH 27 (SUTURE) ×2
SUT VIC AB 2-0 SH 27XBRD (SUTURE) ×1 IMPLANT
SUT VIC AB 3-0 54X BRD REEL (SUTURE) IMPLANT
SUT VIC AB 3-0 BRD 54 (SUTURE)
SUT VICRYL 3-0 CR8 SH (SUTURE) ×2 IMPLANT
SUT VICRYL AB 2 0 TIE (SUTURE) IMPLANT
SUT VICRYL AB 2 0 TIES (SUTURE)
SYR CONTROL 10ML LL (SYRINGE) ×2 IMPLANT
TOWEL GREEN STERILE FF (TOWEL DISPOSABLE) ×2 IMPLANT
TUBE CONNECTING 20X1/4 (TUBING) ×2 IMPLANT
YANKAUER SUCT BULB TIP NO VENT (SUCTIONS) ×2 IMPLANT

## 2020-03-02 NOTE — Anesthesia Procedure Notes (Signed)
Procedure Name: Intubation Date/Time: 03/02/2020 12:14 PM Performed by: Glory Buff, CRNA Pre-anesthesia Checklist: Patient identified, Emergency Drugs available, Suction available and Patient being monitored Patient Re-evaluated:Patient Re-evaluated prior to induction Oxygen Delivery Method: Circle system utilized Preoxygenation: Pre-oxygenation with 100% oxygen Induction Type: IV induction Ventilation: Mask ventilation without difficulty Laryngoscope Size: Miller and 3 Grade View: Grade I Tube type: Oral Tube size: 7.5 mm Number of attempts: 1 Airway Equipment and Method: Stylet and Oral airway Placement Confirmation: ETT inserted through vocal cords under direct vision,  positive ETCO2 and breath sounds checked- equal and bilateral Secured at: 22 cm Tube secured with: Tape Dental Injury: Teeth and Oropharynx as per pre-operative assessment

## 2020-03-02 NOTE — Transfer of Care (Signed)
Immediate Anesthesia Transfer of Care Note  Patient: Derrick York  Procedure(s) Performed: LEFT INGUINAL HERNIA REPAIR (Left Groin) INSERTION OF MESH (Left Groin)  Patient Location: PACU  Anesthesia Type:General  Level of Consciousness: drowsy, patient cooperative and responds to stimulation  Airway & Oxygen Therapy: Patient Spontanous Breathing and Patient connected to face mask oxygen  Post-op Assessment: Report given to RN and Post -op Vital signs reviewed and stable  Post vital signs: Reviewed and stable  Last Vitals:  Vitals Value Taken Time  BP 135/90 03/02/20 1345  Temp    Pulse 72 03/02/20 1345  Resp 16 03/02/20 1345  SpO2 97 % 03/02/20 1345  Vitals shown include unvalidated device data.  Last Pain:  Vitals:   03/02/20 1100  TempSrc: Oral  PainSc: 0-No pain         Complications: No complications documented.

## 2020-03-02 NOTE — Anesthesia Preprocedure Evaluation (Addendum)
Anesthesia Evaluation  Patient identified by MRN, date of birth, ID band Patient awake    Reviewed: Allergy & Precautions, NPO status , Patient's Chart, lab work & pertinent test results  Airway Mallampati: I  TM Distance: >3 FB Neck ROM: Full    Dental no notable dental hx. (+) Teeth Intact, Dental Advisory Given   Pulmonary former smoker,  Quit smoking 1980   Pulmonary exam normal breath sounds clear to auscultation       Cardiovascular negative cardio ROS Normal cardiovascular exam Rhythm:Regular Rate:Normal     Neuro/Psych negative neurological ROS  negative psych ROS   GI/Hepatic (+)     substance abuse (hx EtOH abuse in past)  , Hepatitis - (treated), CInguinal hernia    Endo/Other  negative endocrine ROS  Renal/GU negative Renal ROS  negative genitourinary   Musculoskeletal negative musculoskeletal ROS (+)   Abdominal   Peds  Hematology negative hematology ROS (+)   Anesthesia Other Findings   Reproductive/Obstetrics negative OB ROS                           Anesthesia Physical Anesthesia Plan  ASA: II  Anesthesia Plan: General and Regional   Post-op Pain Management: GA combined w/ Regional for post-op pain   Induction: Intravenous  PONV Risk Score and Plan: 3 and Ondansetron, Dexamethasone, Midazolam and Treatment may vary due to age or medical condition  Airway Management Planned: Oral ETT  Additional Equipment: None  Intra-op Plan:   Post-operative Plan: Extubation in OR  Informed Consent: I have reviewed the patients History and Physical, chart, labs and discussed the procedure including the risks, benefits and alternatives for the proposed anesthesia with the patient or authorized representative who has indicated his/her understanding and acceptance.     Dental advisory given  Plan Discussed with: CRNA  Anesthesia Plan Comments:        Anesthesia  Quick Evaluation

## 2020-03-02 NOTE — Anesthesia Postprocedure Evaluation (Signed)
Anesthesia Post Note  Patient: Derrick York  Procedure(s) Performed: LEFT INGUINAL HERNIA REPAIR (Left Groin) INSERTION OF MESH (Left Groin)     Patient location during evaluation: PACU Anesthesia Type: Regional and General Level of consciousness: awake and alert, oriented and patient cooperative Pain management: pain level controlled Vital Signs Assessment: post-procedure vital signs reviewed and stable Respiratory status: spontaneous breathing, nonlabored ventilation and respiratory function stable Cardiovascular status: blood pressure returned to baseline and stable Postop Assessment: no apparent nausea or vomiting Anesthetic complications: no   No complications documented.  Last Vitals:  Vitals:   03/02/20 1415 03/02/20 1430  BP: 134/86 137/82  Pulse: 73 85  Resp: 16 20  Temp:  36.8 C  SpO2: 98% 96%    Last Pain:  Vitals:   03/02/20 1430  TempSrc:   PainSc: [place]

## 2020-03-02 NOTE — Anesthesia Procedure Notes (Addendum)
Anesthesia Regional Block: TAP block   Pre-Anesthetic Checklist: ,, timeout performed, Correct Patient, Correct Site, Correct Laterality, Correct Procedure, Correct Position, site marked, Risks and benefits discussed, pre-op evaluation,  At surgeon's request and post-op pain management  Laterality: Left  Prep: Maximum Sterile Barrier Precautions used, chloraprep       Needles:  Injection technique: Single-shot  Needle Type: Echogenic Stimulator Needle     Needle Length: 9cm  Needle Gauge: 21     Additional Needles:   Narrative:  Start time: 03/02/2020 11:50 AM End time: 03/02/2020 12:00 PM Injection made incrementally with aspirations every 5 mL. Anesthesiologist: Pervis Hocking, DO  Additional Notes: 2% Lidocaine skin wheel.

## 2020-03-02 NOTE — Op Note (Signed)
Left  Inguinal Hernia repair with mesh  Open, Procedure Note  Indications: The patient presented with a history of a left, reducible inguinal hernia  hernia.  The risk of hernia repair include bleeding,  Infection,   Recurrence of the hernia,  Mesh use, chronic pain,  Organ injury,  Bowel injury,  Bladder injury,   nerve injury with numbness around the incision,  Death,  and worsening of preexisting  medical problems.  The alternatives to surgery have been discussed as well..  Long term expectations of both operative and non operative treatments have been discussed.   The patient agrees to proceed.  Pre-operative Diagnosis: left reducible inguinal hernia   Post-operative Diagnosis: same (INDIRECT)  Surgeon: Turner Daniels  MD   Assistants: NONE   Anesthesia: General endotracheal anesthesia and tap block   ASA Class: 2  Procedure Details  The patient was seen again in the Holding Room. The risks, benefits, complications, treatment options, and expected outcomes were discussed with the patient. The possibilities of reaction to medication, pulmonary aspiration, perforation of viscus, bleeding, recurrent infection, the need for additional procedures, and development of a complication requiring transfusion or further operation were discussed with the patient and/or family. There was concurrence with the proposed plan, and informed consent was obtained. The site of surgery was properly noted/marked. The patient was taken to the Operating Room, identified as Derrick York, and the procedure verified as hernia repair. A Time Out was held and the above information confirmed.  The patient was placed in the supine position and underwent induction of anesthesia, the lower abdomen and groin was prepped and draped in the standard fashion, and 0.25% Marcaine with epinephrine was used to anesthetize the skin over the mid-portion of the inguinal canal. A transverse incision was made. Dissection was carried  through the soft tissue to expose the inguinal canal and inguinal ligament along its lower edge. The external oblique fascia was split along the course of its fibers, exposing the inguinal canal. The cord and nerve were looped using a Penrose drain and reflected out of the field. The indefect  Defect was exposed and a piece of prolene hernia system ultrapro mesh was and placed into  the defect after reduction of the indirect hernia. . Interupted 2-0 novafil suture was then used  to repair the defect, with the suture being sewn from the pubic tubercle inferiorly and superiorly along the canal to a level just beyond the internal ring. The mesh was split to allow passage of the cord and nerve into the canal without entrapment. The contents were then returned to canal and the external oblique fashion was then closed in a continuous fashion using 3-0 Vicryl suture taking care not to cause entrapment. Scarpa's layer closed with 3 0 vicryl and 4 0 monocryl used to close the skin.  Dermabond used for dressing.  Instrument, sponge, and needle counts were correct prior to closure and at the conclusion of the case.  Findings: Hernia as above  Estimated Blood Loss: Minimal         Drains: None         Total IV Fluids: per record          Specimens: none                Complications: None; patient tolerated the procedure well.         Disposition: PACU - hemodynamically stable.         Condition: stable

## 2020-03-02 NOTE — Interval H&P Note (Signed)
History and Physical Interval Note:  03/02/2020 11:53 AM  Derrick York  has presented today for surgery, with the diagnosis of LEFT INGUINAL HERNIA.  The various methods of treatment have been discussed with the patient and family. After consideration of risks, benefits and other options for treatment, the patient has consented to  Procedure(s): LEFT INGUINAL HERNIA REPAIR WITH MESH (Left) as a surgical intervention.  The patient's history has been reviewed, patient examined, no change in status, stable for surgery.  I have reviewed the patient's chart and labs.  Questions were answered to the patient's satisfaction.     Turner Daniels MD

## 2020-03-02 NOTE — H&P (Signed)
Derrick York Derrick York  Location: [facility] Patient #: [id number] DOB: [birth year] Married / Language: English / Race: White Male  History of Present Illness Derrick Moores A. Cassidy Tashiro MD Patient words: Patient returns for follow-up of his left inguinal hernia. He has postponed surgery and is eager to schedule before the end of the year. His left groin swelling is of stable he wears a truss to help with that. He denies nausea vomiting or difficulty voiding at this point in time. No change to his bowel or bladder function.  The patient is a 64 year old male.   Allergies  No Known Drug Allergies Allergies Reconciled  Medication History  No Current Medications Medications Reconciled    Vitals Weight: 188.38 lb Height: 72in Body Surface Area: 2.08 m Body Mass Index: 25.55 kg/m  Temp.: 97.4F  Pulse: 85 (Regular)  BP: 122/72(Sitting, Left Arm, Standard)        Physical Exam   General Mental Status-Alert. General Appearance-Consistent with stated age. Hydration-Well hydrated. Voice-Normal.  Head and Neck Head-normocephalic, atraumatic with no lesions or palpable masses. Trachea-midline. Thyroid Gland Characteristics - normal size and consistency.  Chest and Lung Exam Chest and lung exam reveals -quiet, even and easy respiratory effort with no use of accessory muscles and on auscultation, normal breath sounds, no adventitious sounds and normal vocal resonance. Inspection Chest Wall - Normal. Back - normal.  Cardiovascular Cardiovascular examination reveals -normal heart sounds, regular rate and rhythm with no murmurs and normal pedal pulses bilaterally.  Abdomen Note: Moderate size reducible left inguinal hernia. No evidence of recurrent right inguinal hernia. Soft nontender.    Assessment & Plan   LEFT INGUINAL HERNIA (K40.90) Impression: Discussed repair of left inguinal hernia with mesh. The risk  of hernia repair include bleeding, infection, organ injury, bowel injury, bladder injury, nerve injury recurrent hernia, blood clots, worsening of underlying condition, chronic pain, mesh use, open surgery, death, and the need for other operations. Pt agrees to proceed  Current Plans You are being scheduled for surgery- Our schedulers will call you.  You should hear from our office's scheduling department within 5 working days about the location, date, and time of surgery. We try to make accommodations for patient's preferences in scheduling surgery, but sometimes the OR schedule or the surgeon's schedule prevents [name] from making those accommodations.  If you have not heard from our office [phone]) in 5 working days, call the office and ask for your surgeon's nurse.  If you have other questions about your diagnosis, plan, or surgery, call the office and ask for your surgeon's nurse.  Pt Education - Pamphlet Given - Hernia Surgery: discussed with patient and provided information. Pt Education - [facility] Mesh education: discussed with patient and provided information.        Electronically signed by Erroll Luna, MD

## 2020-03-02 NOTE — Progress Notes (Signed)
Assisted Dr. Doroteo Glassman with left, ultrasound guided, transabdominal plane block. Side rails up, monitors on throughout procedure. See vital signs in flow sheet. Tolerated Procedure well.

## 2020-03-02 NOTE — Discharge Instructions (Signed)
[facility] _______[facility]  UMBILICAL OR INGUINAL HERNIA REPAIR: POST OP INSTRUCTIONS  Always review your discharge instruction sheet given to you by the facility where your surgery was performed. IF YOU HAVE DISABILITY OR FAMILY LEAVE FORMS, YOU MUST BRING THEM TO THE OFFICE FOR PROCESSING.   DO NOT GIVE THEM TO YOUR DOCTOR.  1. A  prescription for pain medication may be given to you upon discharge.  Take your pain medication as prescribed, if needed.  If narcotic pain medicine is not needed, then you may take acetaminophen (Tylenol) or ibuprofen (Advil) as needed. 2. Take your usually prescribed medications unless otherwise directed. If you need a refill on your pain medication, please contact your pharmacy.  They will contact our office to request authorization. Prescriptions will not be filled after 5 pm or on week-ends. 3. You should follow a light diet the first 24 hours after arrival home, such as soup and crackers, etc.  Be sure to include lots of fluids daily.  Resume your normal diet the day after surgery. 4.Most patients will experience some swelling and bruising around the umbilicus or in the groin and scrotum.  Ice packs and reclining will help.  Swelling and bruising can take several days to resolve.  6. It is common to experience some constipation if taking pain medication after surgery.  Increasing fluid intake and taking a stool softener (such as Colace) will usually help or prevent this problem from occurring.  A mild laxative (Milk of Magnesia or Miralax) should be taken according to package directions if there are no bowel movements after 48 hours. 7. Unless discharge instructions indicate otherwise, you may remove your bandages 24-48 hours after surgery, and you may shower at that time.  You may have steri-strips (small skin tapes) in place directly over the incision.  These strips should be left on the skin for 7-10 days.  If your surgeon used skin glue on the  incision, you may shower in 24 hours.  The glue will flake off over the next 2-3 weeks.  Any sutures or staples will be removed at the office during your follow-up visit. 8. ACTIVITIES:  You may resume regular (light) daily activities beginning the next day--such as daily self-care, walking, climbing stairs--gradually increasing activities as tolerated.  You may have sexual intercourse when it is comfortable.  Refrain from any heavy lifting or straining until approved by your doctor.  a.You may drive when you are no longer taking prescription pain medication, you can comfortably wear a seatbelt, and you can safely maneuver your car and apply brakes. b.RETURN TO WORK:   _____________________________________________  9.You should see your doctor in the office for a follow-up appointment approximately 2-3 weeks after your surgery.  Make sure that you call for this appointment within a day or two after you arrive home to insure a convenient appointment time. 10.OTHER INSTRUCTIONS: _________________________    _____________________________________  WHEN TO CALL YOUR DOCTOR: 1. Fever over 101.0 2. Inability to urinate 3. Nausea and/or vomiting 4. Extreme swelling or bruising 5. Continued bleeding from incision. 6. Increased pain, redness, or drainage from the incision  The clinic staff is available to answer your questions during regular business hours.  Please don't hesitate to call and ask to speak to one of the nurses for clinical concerns.  If you have a medical emergency, go to the nearest emergency room or call 911.  A surgeon from [facility] is always on call at the hospital     [address], [place], [place], [place]  [address] ?  [address], [place], [place]   [address] [phone] ? [phone] ? FAX [phone] Web site: [email or web address]  NO TYLENOL OR IBUPROFEN UNTIL 5:00PM, IF NEEDED Post Anesthesia Home Care Instructions  Activity: Get  plenty of rest for the remainder of the day. A responsible individual must stay with you for 24 hours following the procedure.  For the next 24 hours, DO NOT: -Drive a car -Paediatric nurse -Drink alcoholic beverages -Take any medication unless instructed by your physician -Make any legal decisions or sign important papers.  Meals: Start with liquid foods such as gelatin or soup. Progress to regular foods as tolerated. Avoid greasy, spicy, heavy foods. If nausea and/or vomiting occur, drink only clear liquids until the nausea and/or vomiting subsides. Call your physician if vomiting continues.  Special Instructions/Symptoms: Your throat may feel dry or sore from the anesthesia or the breathing tube placed in your throat during surgery. If this causes discomfort, gargle with warm salt water. The discomfort should disappear within 24 hours.  If you had a scopolamine patch placed behind your ear for the management of post- operative nausea and/or vomiting:  1. The medication in the patch is effective for 72 hours, after which it should be removed.  Wrap patch in a tissue and discard in the trash. Wash hands thoroughly with soap and water. 2. You may remove the patch earlier than 72 hours if you experience unpleasant side effects which may include dry mouth, dizziness or visual disturbances. 3. Avoid touching the patch. Wash your hands with soap and water after contact with the patch.

## 2020-03-03 ENCOUNTER — Encounter (HOSPITAL_BASED_OUTPATIENT_CLINIC_OR_DEPARTMENT_OTHER): Payer: Self-pay | Admitting: Surgery

## 2020-03-18 DIAGNOSIS — M25521 Pain in right elbow: Secondary | ICD-10-CM | POA: Diagnosis not present

## 2020-03-25 DIAGNOSIS — M25521 Pain in right elbow: Secondary | ICD-10-CM | POA: Diagnosis not present

## 2020-07-30 ENCOUNTER — Other Ambulatory Visit: Payer: Self-pay | Admitting: Nurse Practitioner

## 2020-07-30 DIAGNOSIS — B182 Chronic viral hepatitis C: Secondary | ICD-10-CM

## 2020-07-30 DIAGNOSIS — K7401 Hepatic fibrosis, early fibrosis: Secondary | ICD-10-CM

## 2021-06-03 ENCOUNTER — Other Ambulatory Visit: Payer: Self-pay

## 2021-06-03 ENCOUNTER — Encounter: Payer: Self-pay | Admitting: Family Medicine

## 2021-06-03 ENCOUNTER — Ambulatory Visit (INDEPENDENT_AMBULATORY_CARE_PROVIDER_SITE_OTHER): Payer: Medicare Other | Admitting: Family Medicine

## 2021-06-03 VITALS — BP 108/60 | HR 78 | Temp 97.8°F | Ht 73.0 in | Wt 196.5 lb

## 2021-06-03 DIAGNOSIS — K7400 Hepatic fibrosis, unspecified: Secondary | ICD-10-CM | POA: Diagnosis not present

## 2021-06-03 DIAGNOSIS — D1803 Hemangioma of intra-abdominal structures: Secondary | ICD-10-CM

## 2021-06-03 DIAGNOSIS — L821 Other seborrheic keratosis: Secondary | ICD-10-CM

## 2021-06-03 DIAGNOSIS — Z1211 Encounter for screening for malignant neoplasm of colon: Secondary | ICD-10-CM | POA: Diagnosis not present

## 2021-06-03 NOTE — Patient Instructions (Signed)
Schedule your Liver ultrasound ?Call Door  ? ?Bring back stool sample ? ? ?Seborrheic Keratosis ?A seborrheic keratosis is a common, noncancerous (benign) skin growth. These growths are velvety, waxy, rough, tan, brown, or black spots that appear on the skin. These skin growths can be flat or raised, and scaly. ?What are the causes? ?The cause of this condition is not known. ?What increases the risk? ?You are more likely to develop this condition if you: ?Have a family history of seborrheic keratosis. ?Are 50 or older. ?Are pregnant. ?Have had estrogen replacement therapy. ?What are the signs or symptoms? ?Symptoms of this condition include growths on the face, chest, shoulders, back, or other areas. These growths: ?Are usually painless, but may become irritated and itchy. ?Can be yellow, brown, black, or other colors. ?Are slightly raised or have a flat surface. ?Are sometimes rough or wart-like in texture. ?Are often velvety or waxy on the surface. ?Are round or oval-shaped. ?Often occur in groups, but may occur as a single growth. ?How is this diagnosed? ?This condition is diagnosed with a medical history and physical exam. ?A sample of the growth may be tested (skin biopsy). ?You may need to see a skin specialist (dermatologist). ?How is this treated? ?Treatment is not usually needed for this condition, unless the growths are irritated or bleed often. ?You may also choose to have the growths removed if you do not like their appearance. ?Most commonly, these growths are treated with a procedure in which liquid nitrogen is applied to "freeze" off the growth (cryosurgery). ?They may also be burned off with electricity (electrocautery) or removed by scraping (curettage). ?Follow these instructions at home: ?Watch your growth for any changes. ?Keep all follow-up visits as told by your health care provider. This is important. ?Do not scratch or pick at the growth or growths. This can cause them to become  irritated or infected. ?Contact a health care provider if: ?You suddenly have many new growths. ?Your growth bleeds, itches, or hurts. ?Your growth suddenly becomes larger or changes color. ?Summary ?A seborrheic keratosis is a common, noncancerous (benign) skin growth. ?Treatment is not usually needed for this condition, unless the growths are irritated or bleed often. ?Watch your growth for any changes. ?Contact a health care provider if you suddenly have many new growths or your growth suddenly becomes larger or changes color. ?Keep all follow-up visits as told by your health care provider. This is important. ?This information is not intended to replace advice given to you by your health care provider. Make sure you discuss any questions you have with your health care provider. ?Document Revised: 07/19/2017 Document Reviewed: 07/19/2017 ?Elsevier Patient Education ? [place]. ? ?

## 2021-06-03 NOTE — Assessment & Plan Note (Signed)
Reassurance provided discussed reasons for follow-up and removal in the future ?

## 2021-06-03 NOTE — Progress Notes (Signed)
? ?Subjective:  ? ?  ?Derrick York is a 66 y.o. male presenting for Skin Discoloration [place] raised area on R calf ) ?  ? ? ?HPI ? ?#skin discoloration ?- brown lesion on right calf ?- not sure when it started ?- wife noticed it and was worried ? ? ?#Liver fibrosis ?- insurance change ?- stopped going to liver specialist ?- concerned about cost ? ? ?Review of Systems ? ? ?Social History  ? ?Tobacco Use  ?Smoking Status Former  ? Packs/day: 0.50  ? Years: 5.00  ? Pack years: 2.50  ? Types: Cigarettes  ? Quit date: 64  ? Years since quitting: 43.2  ?Smokeless Tobacco Never  ? ? ? ?   ?Objective:  ?  ?BP Readings from Last 3 Encounters:  ?06/03/21 108/60  ?03/02/20 137/82  ?02/09/20 92/68  ? ?Wt Readings from Last 3 Encounters:  ?06/03/21 196 lb 8 oz (89.1 kg)  ?03/02/20 195 lb 1.7 oz (88.5 kg)  ?02/09/20 192 lb 4 oz (87.2 kg)  ? ? ?BP 108/60   Pulse 78   Temp 97.8 ?F (36.6 ?C) (Oral)   Ht '6\' 1"'$  (1.854 m)   Wt 196 lb 8 oz (89.1 kg)   SpO2 94%   BMI 25.93 kg/m?  ? ? ?Physical Exam ?Constitutional:   ?   Appearance: Normal appearance. He is not ill-appearing or diaphoretic.  ?HENT:  ?   Right Ear: External ear normal.  ?   Left Ear: External ear normal.  ?Eyes:  ?   General: No scleral icterus. ?   Extraocular Movements: Extraocular movements intact.  ?   Conjunctiva/sclera: Conjunctivae normal.  ?Cardiovascular:  ?   Rate and Rhythm: Normal rate and regular rhythm.  ?   Heart sounds: No murmur heard. ?Pulmonary:  ?   Effort: Pulmonary effort is normal. No respiratory distress.  ?   Breath sounds: Normal breath sounds. No wheezing.  ?Musculoskeletal:  ?   Cervical back: Neck supple.  ?Skin: ?   General: Skin is warm and dry.  ?   Comments: Right posterior calf with small grey scaly lesion.   ?Neurological:  ?   Mental Status: He is alert. Mental status is at baseline.  ?Psychiatric:     ?   Mood and Affect: Mood normal.  ? ? ? ? ? ?   ?Assessment & Plan:  ? ?Problem List Items Addressed This Visit   ? ?  ?  Digestive  ? Liver fibrosis - Primary  ?  He does not want to do labs today due to cost, reviewed previous ultrasound and recommendation for 6 months follow-up which was not done and 1 year overdue.  He notes insurance issues and cost is the reason he did not follow-up with the liver specialist.  Discussed repeating ultrasound and this was ordered, pending results we will plan for referral as needed.  Of note he did complete hepatitis C treatment. ?  ?  ? Relevant Orders  ? US Abdomen Limited RUQ (LIVER/GB)  ?  ? Musculoskeletal and Integument  ? Seborrheic keratosis  ?  Reassurance provided discussed reasons for follow-up and removal in the future ?  ?  ? ?Other Visit Diagnoses   ? ? Hemangioma of liver      ? Relevant Orders  ? US Abdomen Limited RUQ (LIVER/GB)  ? Screening for colon cancer      ? Relevant Orders  ? Fecal occult blood, imunochemical  ? ?  ? ? ? ?  Return in about 6 months (around 12/04/2021) for wellness visit. ? ?Lesleigh Noe, MD ? ?This visit occurred during the SARS-CoV-2 public health emergency.  Safety protocols were in place, including screening questions prior to the visit, additional usage of staff PPE, and extensive cleaning of exam room while observing appropriate contact time as indicated for disinfecting solutions.  ? ?

## 2021-06-03 NOTE — Assessment & Plan Note (Addendum)
He does not want to do labs today due to cost, reviewed previous ultrasound and recommendation for 6 months follow-up which was not done and 1 year overdue.  He notes insurance issues and cost is the reason he did not follow-up with the liver specialist.  Discussed repeating ultrasound and this was ordered, pending results we will plan for referral as needed.  Of note he did complete hepatitis C treatment. ?

## 2021-08-03 ENCOUNTER — Ambulatory Visit
Admission: RE | Admit: 2021-08-03 | Discharge: 2021-08-03 | Disposition: A | Discharge status: Home / Self Care | Payer: Medicare Other | Source: Ambulatory Visit | Attending: Family Medicine | Admitting: Family Medicine

## 2021-08-03 DIAGNOSIS — K824 Cholesterolosis of gallbladder: Secondary | ICD-10-CM | POA: Diagnosis not present

## 2021-08-03 DIAGNOSIS — K7400 Hepatic fibrosis, unspecified: Secondary | ICD-10-CM

## 2021-08-03 DIAGNOSIS — D1803 Hemangioma of intra-abdominal structures: Secondary | ICD-10-CM

## 2021-10-06 IMAGING — DX DG ELBOW COMPLETE 3+V*R*
4 series · 4 of 4 positions shown · non-contrast
Comparison: None.

CLINICAL DATA: Right elbow pain. The patient suffered an
unspecified injury.

EXAM:
RIGHT ELBOW - COMPLETE 3+ VIEW

[elbow ap]
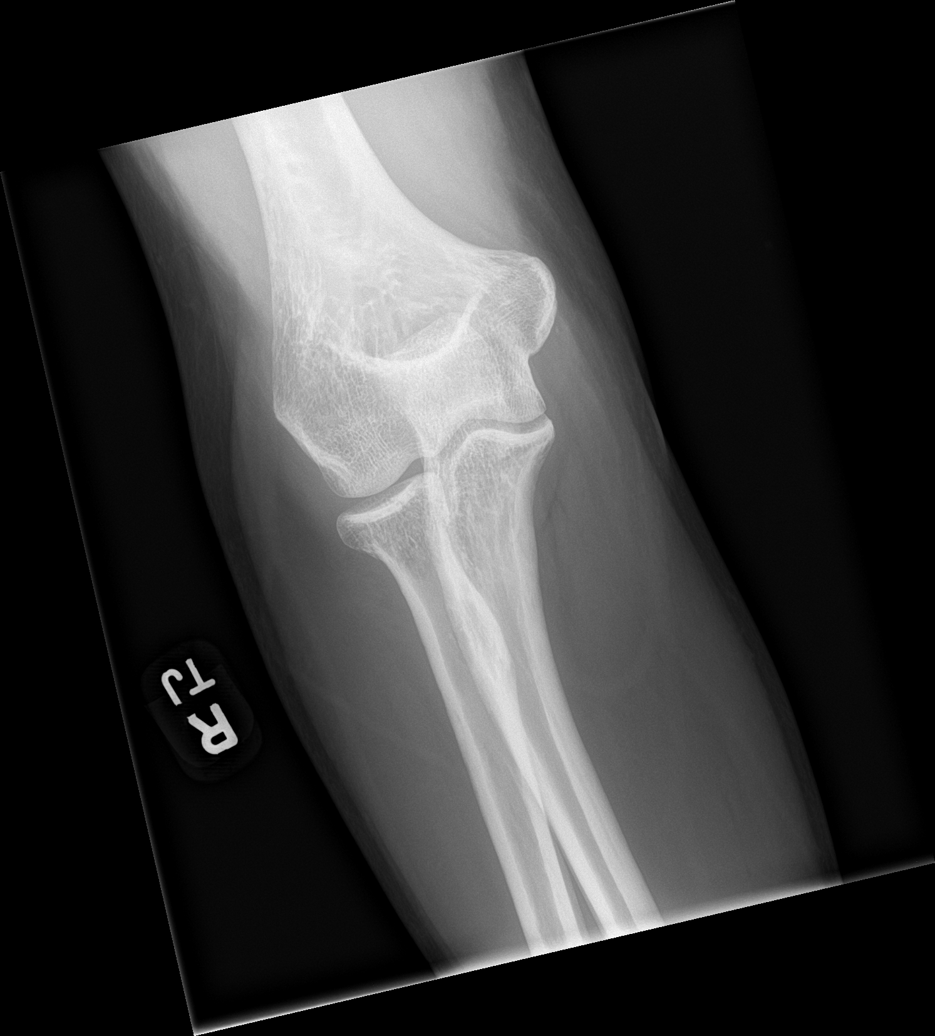

[elbow obl (1 of 2)]
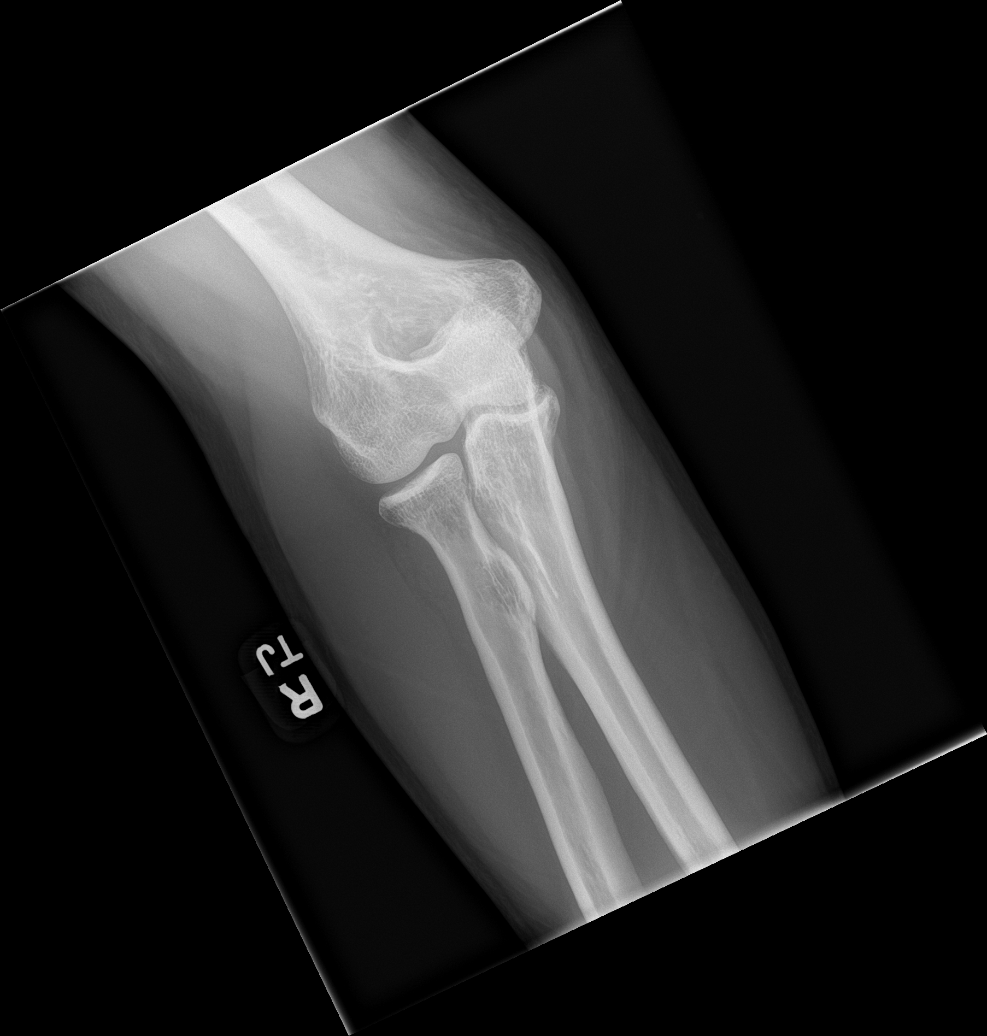

[elbow lat]
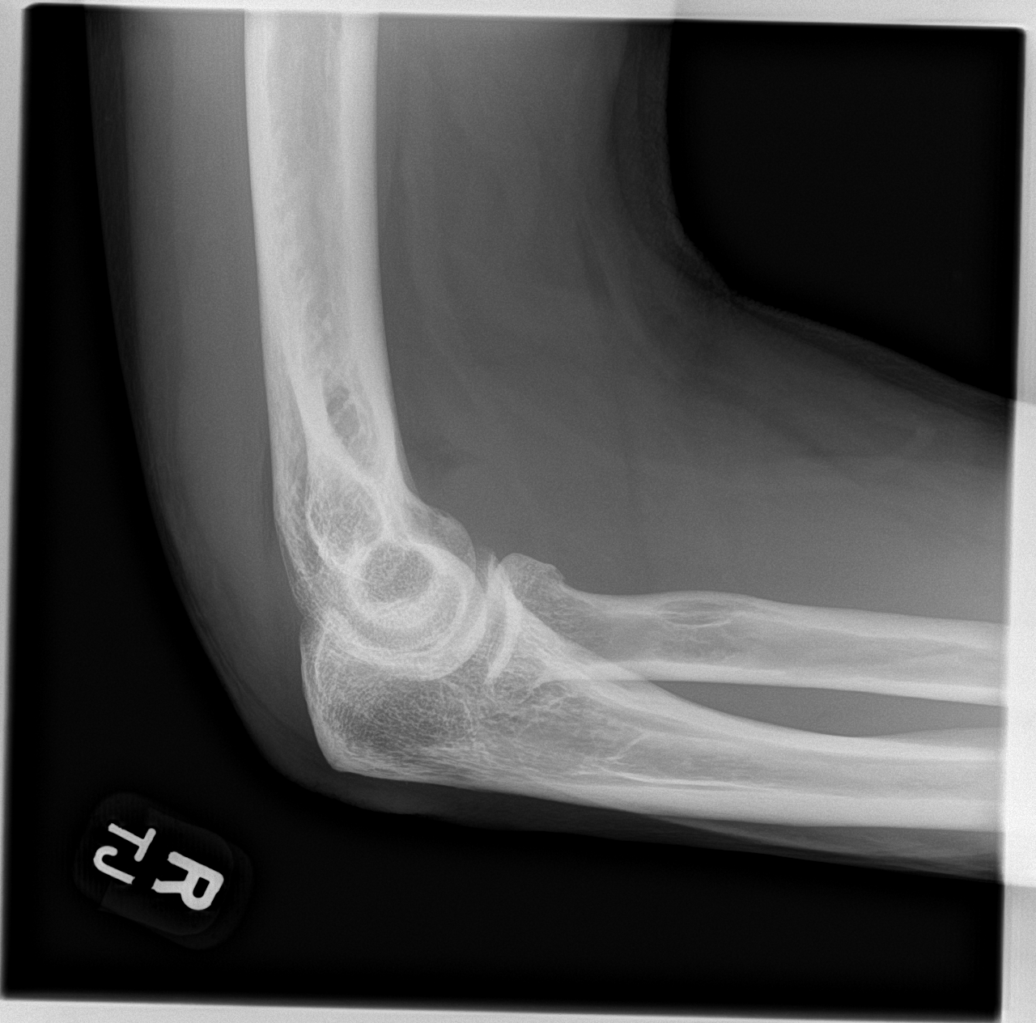

[elbow obl (2 of 2)]
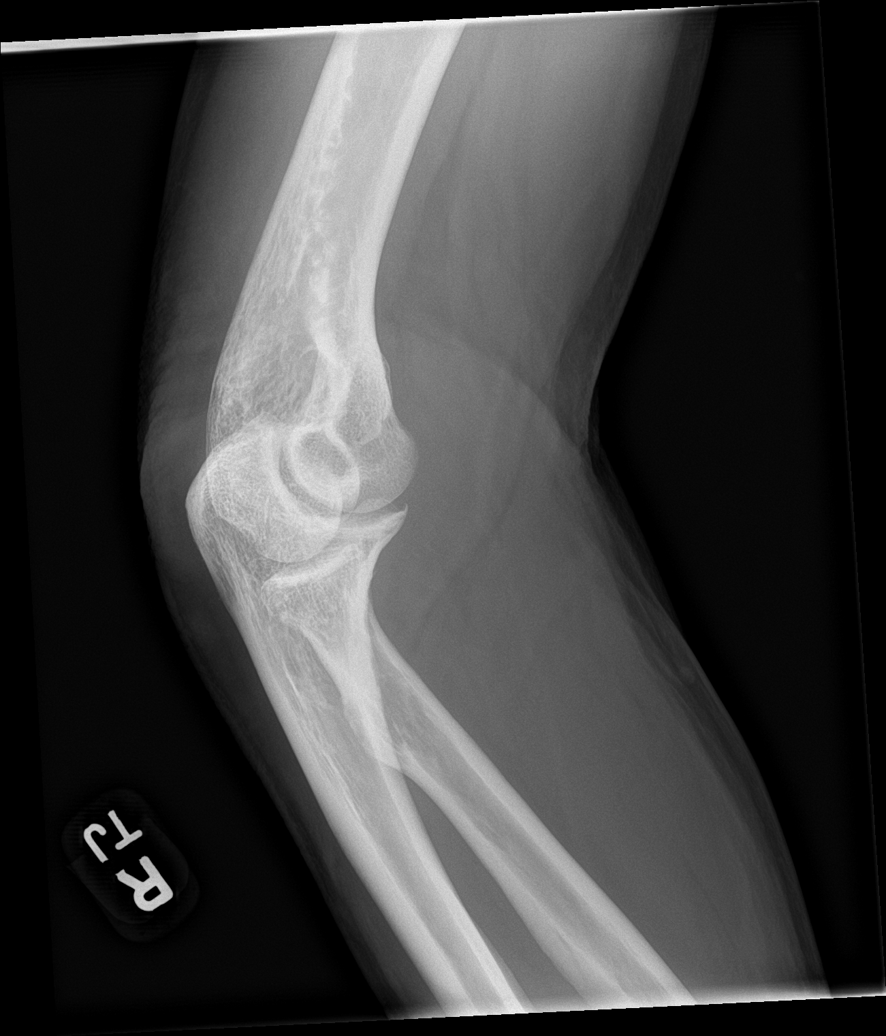

[4 of 4 positions shown; findings below may reference images not displayed]

FINDINGS: The patient has a large elbow joint effusion. A subtle, nondisplaced
fracture of the radial head is identified. No other acute or focal
bony abnormality.
IMPRESSION: Subtle, nondisplaced fracture of the right radial head with an
associated elbow joint effusion.

## 2021-10-13 DIAGNOSIS — K7400 Hepatic fibrosis, unspecified: Secondary | ICD-10-CM | POA: Diagnosis not present

## 2021-10-17 ENCOUNTER — Telehealth: Payer: Self-pay | Admitting: Family Medicine

## 2021-10-17 ENCOUNTER — Ambulatory Visit: Payer: Medicare Other

## 2021-10-17 NOTE — Telephone Encounter (Signed)
Patient called back and apologized because he had an appt at 55 today and he said he doesn't remember setting up at that time because [address] usually doesn't work for him because he works at that time. Can someone call back to reschedule at [phone]

## 2021-10-31 ENCOUNTER — Ambulatory Visit: Payer: Medicare Other

## 2021-11-03 ENCOUNTER — Ambulatory Visit (INDEPENDENT_AMBULATORY_CARE_PROVIDER_SITE_OTHER): Payer: Medicare Other

## 2021-11-03 VITALS — Wt 196.0 lb

## 2021-11-03 DIAGNOSIS — Z0000 Encounter for general adult medical examination without abnormal findings: Secondary | ICD-10-CM

## 2021-11-03 NOTE — Patient Instructions (Signed)
Derrick York , Thank you for taking time to come for your Medicare Wellness Visit. I appreciate your ongoing commitment to your health goals. Please review the following plan we discussed and let me know if I can assist you in the future.   Screening recommendations/referrals: Colonoscopy: Return Cologuard soon - if it is expired, call [facility] and request a new one Recommended yearly ophthalmology/optometry visit for glaucoma screening and checkup Recommended yearly dental visit for hygiene and checkup  Vaccinations: declines all Influenza vaccine: recommend every Fall Pneumococcal vaccine: recommend once per lifetime Prevnar-20 Tdap vaccine: Done 09/07/2016 - recommend every 10 years Shingles vaccine: recommend Shingrix which is 2 doses 2-6 months apart and over 90% effective     Covid-19: Done 05/20/2019 & 06/17/2019 - declines booster  Advanced directives: Advance directive discussed with you today. Even though you declined this today, please call our office should you change your mind, and we can give you the proper paperwork for you to fill out.   Conditions/risks identified: Keep up the great work! Aim for 30 minutes of exercise or brisk walking, 6-8 glasses of water, and 5 servings of fruits and vegetables each day.   Next appointment: Follow up in one year for your annual wellness visit.   Preventive Care 76 Years and Older, Male  Preventive care refers to lifestyle choices and visits with your health care provider that can promote health and wellness. What does preventive care include? A yearly physical exam. This is also called an annual well check. Dental exams once or twice a year. Routine eye exams. Ask your health care provider how often you should have your eyes checked. Personal lifestyle choices, including: Daily care of your teeth and gums. Regular physical activity. Eating a healthy diet. Avoiding tobacco and drug use. Limiting alcohol use. Practicing safe  sex. Taking low doses of aspirin every day. Taking vitamin and mineral supplements as recommended by your health care provider. What happens during an annual well check? The services and screenings done by your health care provider during your annual well check will depend on your age, overall health, lifestyle risk factors, and family history of disease. Counseling  Your health care provider may ask you questions about your: Alcohol use. Tobacco use. Drug use. Emotional well-being. Home and relationship well-being. Sexual activity. Eating habits. History of falls. Memory and ability to understand (cognition). Work and work Statistician. Screening  You may have the following tests or measurements: Height, weight, and BMI. Blood pressure. Lipid and cholesterol levels. These may be checked every 5 years, or more frequently if you are over 6 years old. Skin check. Lung cancer screening. You may have this screening every year starting at age 45 if you have a 30-pack-year history of smoking and currently smoke or have quit within the past 15 years. Fecal occult blood test (FOBT) of the stool. You may have this test every year starting at age 63. Flexible sigmoidoscopy or colonoscopy. You may have a sigmoidoscopy every 5 years or a colonoscopy every 10 years starting at age 59. Prostate cancer screening. Recommendations will vary depending on your family history and other risks. Hepatitis C blood test. Hepatitis B blood test. Sexually transmitted disease (STD) testing. Diabetes screening. This is done by checking your blood sugar (glucose) after you have not eaten for a while (fasting). You may have this done every 1-3 years. Abdominal aortic aneurysm (AAA) screening. You may need this if you are a current or former smoker. Osteoporosis. You may be  screened starting at age 25 if you are at high risk. Talk with your health care provider about your test results, treatment options, and if  necessary, the need for more tests. Vaccines  Your health care provider may recommend certain vaccines, such as: Influenza vaccine. This is recommended every year. Tetanus, diphtheria, and acellular pertussis (Tdap, Td) vaccine. You may need a Td booster every 10 years. Zoster vaccine. You may need this after age 67. Pneumococcal 13-valent conjugate (PCV13) vaccine. One dose is recommended after age 40. Pneumococcal polysaccharide (PPSV23) vaccine. One dose is recommended after age 55. Talk to your health care provider about which screenings and vaccines you need and how often you need them. This information is not intended to replace advice given to you by your health care provider. Make sure you discuss any questions you have with your health care provider. Document Released: 04/02/2015 Document Revised: 11/24/2015 Document Reviewed: 01/05/2015 Elsevier Interactive Patient Education  2017 [place] Prevention in the Home Falls can cause injuries. They can happen to people of all ages. There are many things you can do to make your home safe and to help prevent falls. What can I do on the outside of my home? Regularly fix the edges of walkways and driveways and fix any cracks. Remove anything that might make you trip as you walk through a door, such as a raised step or threshold. Trim any bushes or trees on the path to your home. Use bright outdoor lighting. Clear any walking paths of anything that might make someone trip, such as rocks or tools. Regularly check to see if handrails are loose or broken. Make sure that both sides of any steps have handrails. Any raised decks and porches should have guardrails on the edges. Have any leaves, snow, or ice cleared regularly. Use sand or salt on walking paths during winter. Clean up any spills in your garage right away. This includes oil or grease spills. What can I do in the bathroom? Use night lights. Install grab bars by the toilet  and in the tub and shower. Do not use towel bars as grab bars. Use non-skid mats or decals in the tub or shower. If you need to sit down in the shower, use a plastic, non-slip stool. Keep the floor dry. Clean up any water that spills on the floor as soon as it happens. Remove soap buildup in the tub or shower regularly. Attach bath mats securely with double-sided non-slip rug tape. Do not have throw rugs and other things on the floor that can make you trip. What can I do in the bedroom? Use night lights. Make sure that you have a light by your bed that is easy to reach. Do not use any sheets or blankets that are too big for your bed. They should not hang down onto the floor. Have a firm chair that has side arms. You can use this for support while you get dressed. Do not have throw rugs and other things on the floor that can make you trip. What can I do in the kitchen? Clean up any spills right away. Avoid walking on wet floors. Keep items that you use a lot in easy-to-reach places. If you need to reach something above you, use a strong step stool that has a grab bar. Keep electrical cords out of the way. Do not use floor polish or wax that makes floors slippery. If you must use wax, use non-skid floor wax. Do not have  throw rugs and other things on the floor that can make you trip. What can I do with my stairs? Do not leave any items on the stairs. Make sure that there are handrails on both sides of the stairs and use them. Fix handrails that are broken or loose. Make sure that handrails are as long as the stairways. Check any carpeting to make sure that it is firmly attached to the stairs. Fix any carpet that is loose or worn. Avoid having throw rugs at the top or bottom of the stairs. If you do have throw rugs, attach them to the floor with carpet tape. Make sure that you have a light switch at the top of the stairs and the bottom of the stairs. If you do not have them, ask someone to add  them for you. What else can I do to help prevent falls? Wear shoes that: Do not have high heels. Have rubber bottoms. Are comfortable and fit you well. Are closed at the toe. Do not wear sandals. If you use a stepladder: Make sure that it is fully opened. Do not climb a closed stepladder. Make sure that both sides of the stepladder are locked into place. Ask someone to hold it for you, if possible. Clearly mark and make sure that you can see: Any grab bars or handrails. First and last steps. Where the edge of each step is. Use tools that help you move around (mobility aids) if they are needed. These include: Canes. Walkers. Scooters. Crutches. Turn on the lights when you go into a dark area. Replace any light bulbs as soon as they burn out. Set up your furniture so you have a clear path. Avoid moving your furniture around. If any of your floors are uneven, fix them. If there are any pets around you, be aware of where they are. Review your medicines with your doctor. Some medicines can make you feel dizzy. This can increase your chance of falling. Ask your doctor what other things that you can do to help prevent falls. This information is not intended to replace advice given to you by your health care provider. Make sure you discuss any questions you have with your health care provider. Document Released: 12/31/2008 Document Revised: 08/12/2015 Document Reviewed: 04/10/2014 Elsevier Interactive Patient Education  2017 [employer].

## 2021-11-03 NOTE — Progress Notes (Addendum)
Subjective:   Derrick York is a 66 y.o. male who presents for an Initial Medicare Annual Wellness Visit.  Virtual Visit via Telephone Note  I connected with  Dragan Tamburrino Deshler on 11/03/21 at  9:00 AM EDT by telephone and verified that I am speaking with the correct person using two identifiers.  Location: Patient: Home Provider: [place] Persons participating in the virtual visit: patient/Nurse Health Advisor   I discussed the limitations, risks, security and privacy concerns of performing an evaluation and management service by telephone and the availability of in person appointments. The patient expressed understanding and agreed to proceed.  Interactive audio and video telecommunications were attempted between this nurse and patient, however failed, due to patient having technical difficulties OR patient did not have access to video capability.  We continued and completed visit with audio only.  Some vital signs may be absent or patient reported.   Sarafina Puthoff E Ashlan Dignan, LPN   Review of Systems     Cardiac Risk Factors include: advanced age (>34mn, >>44women);male gender;Other (see comment), Risk factor comments: chronic Hepatitis C, liver fibrosis     Objective:    Today's Vitals   11/03/21 0905  Weight: 196 lb (88.9 kg)   Body mass index is 25.86 kg/m.     11/03/2021    9:11 AM 03/02/2020   10:51 AM 02/24/2020    1:22 PM 03/11/2014    7:13 AM 03/09/2014    9:54 AM  Advanced Directives  Does Patient Have a Medical Advance Directive? No No No No No  Would patient like information on creating a medical advance directive? No - Patient declined No - Patient declined No - Patient declined  No - patient declined information    Current Medications (verified) Outpatient Encounter Medications as of 11/03/2021  Medication Sig   acetaminophen (TYLENOL) 500 MG tablet Take 500 mg by mouth every 6 (six) hours as needed.   Multiple Vitamins-Minerals (MENS MULTIVITAMIN  PO) Take by mouth daily.   [DISCONTINUED] ibuprofen (ADVIL) 200 MG tablet Take 200 mg by mouth every 6 (six) hours as needed. (Patient not taking: Reported on 11/03/2021)   [DISCONTINUED] ibuprofen (ADVIL) 800 MG tablet Take 1 tablet (800 mg total) by mouth every 8 (eight) hours as needed.   No facility-administered encounter medications on file as of 11/03/2021.    Allergies (verified) Codeine   History: Past Medical History:  Diagnosis Date   [place]ronic [place] C ([place] 05[place]   Followed by DRoosevelt Locks NP   History of alcohol abuse    History of hepatitis C 2020   had 12 week treatment and does not have anymore   History of migraine    Past Surgical History:  Procedure Laterality Date   INGUINAL HERNIA REPAIR Right 03/11/2014   Procedure: RIGHT INGUINAL HERNIA REPAIR ;  Surgeon: TErroll Luna MD;  [place] [facility]  Service: General;  Laterality: Right;   INGUINAL HERNIA REPAIR Left 03/02/2020   Procedure: LEFT INGUINAL HERNIA REPAIR;  Surgeon: CErroll Luna MD;  [place] [facility]  Service: General;  Laterality: Left;   INSERTION OF MESH Right 03/11/2014   Procedure: INSERTION OF MESH;  Surgeon: TErroll Luna MD;  [place] [facility]  Service: General;  Laterality: Right;   INSERTION OF MESH Left 03/02/2020   Procedure: INSERTION OF MESH;  Surgeon: CErroll Luna MD;  [place] [facility]  Service: General;  Laterality: Left;   KNEE ARTHROSCOPY  1995   right   Family History  Problem Relation Age of Onset   Early death Mother    Hypertension Mother    Liver cancer Mother    Lung cancer Mother 61   Heart attack Father 47   Alcohol abuse Father    Alcohol abuse Brother    Drug abuse Brother    Asthma Son    Asthma Son    Healthy Maternal Grandmother    Alcohol abuse Maternal Grandfather 61   Dementia Paternal Grandmother    Aneurysm Paternal Grandfather 59   Social History    Socioeconomic History   Marital status: Married    Spouse name: Levada Dy   Number of children: 4   Years of education: BFA in Dance   Highest education level: Not on file  Occupational History   Not on file  Tobacco Use   Smoking status: Former    Packs/day: 0.50    Years: 5.00    Total pack years: 2.50    Types: Cigarettes    Quit date: 1980    Years since quitting: 43.6   Smokeless tobacco: Never  Vaping Use   Vaping Use: Never used  Substance and Sexual Activity   Alcohol use: No    Comment: not since 1980-age 89 he quit   Drug use: Not Currently    Comment: no needles, and not since 1980   Sexual activity: Yes    Birth control/protection: None    Comment: pull out method  Other Topics Concern   Not on file  Social History Narrative   Works at [employer] - so occasionally difficult in the [place] with [place] and Lurena Joiner - teenagers   2 older children - Theadore Nan and Wille Glaser - live out of state   Enjoys: playing music, enjoys biking as well   Exercise: lifting light weights, walking   Diet: vegetarian, recently gave up sugar, watches for protein   Social Determinants of Health   Financial Resource Strain: Low Risk  (11/03/2021)   Overall Financial Resource Strain (CARDIA)    Difficulty of Paying Living Expenses: Not hard at all  Food Insecurity: No Food Insecurity (11/03/2021)   Hunger Vital Sign    Worried About Running Out of Food in the Last Year: Never true    [place] in the Last Year: Never true  Transportation Needs: No Transportation Needs (11/03/2021)   PRAPARE - Hydrologist (Medical): No    Lack of Transportation (Non-Medical): No  Physical Activity: Sufficiently Active (11/03/2021)   Exercise Vital Sign    Days of Exercise per Week: 7 days    Minutes of Exercise per Session: 40 min  Stress: No Stress Concern Present (11/03/2021)   [place]     Feeling of Stress : Not at all  Social Connections: Moderately Integrated (11/03/2021)   Social Connection and Isolation Panel [NHANES]    Frequency of Communication with Friends and Family: More than three times a week    Frequency of Social Gatherings with Friends and Family: More than three times a week    Attends Religious Services: Never    Marine scientist or Organizations: No    Attends Music therapist: More than 4 times per year    Marital Status: Married    Tobacco Counseling Counseling given: Not Answered   Clinical Intake:  Airline pilot  completed: Yes  Pain : No/denies pain     BMI - recorded: 25.86 Nutritional Status: BMI 25 -29 Overweight Nutritional Risks: None Diabetes: No  How often do you need to have someone help you when you read instructions, pamphlets, or other written materials from your doctor or pharmacy?: 1 - Never  Diabetic? no  Interpreter Needed?: No  Information entered by :: Jumanah Hynson, LPN   Activities of Daily Living    11/03/2021    9:11 AM  In your present state of health, do you have any difficulty performing the following activities:  Hearing? 0  Vision? 0  Difficulty concentrating or making decisions? 0  Walking or climbing stairs? 0  Dressing or bathing? 0  Doing errands, shopping? 0  Preparing Food and eating ? N  Using the Toilet? N  In the past six months, have you accidently leaked urine? Y  Comment urge incontinence - very mild  Do you have problems with loss of bowel control? N  Managing your Medications? N  Managing your Finances? N  Housekeeping or managing your Housekeeping? N    Patient Care Team: Lesleigh Noe, MD as PCP - General (Family Medicine) Roosevelt Locks, CRNP as Consulting Physician (Nurse Practitioner)  Indicate any recent Medical Services you may have received from other than [facility] providers in the past year (date may be approximate).     Assessment:   This is  a routine wellness examination for [place].  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 0  Number falls in past yr: 0  Injury with Fall? 0  Risk for fall due to : Orthopedic patient  Follow up Falls prevention discussed    [place]:  Any stairs in or around the home? Yes  If so, are there any without handrails? No  Home free of loose throw rugs in walkways, pet beds, electrical cords, etc? Yes  Adequate lighting in your home to reduce risk of falls? Yes   ASSISTIVE DEVICES UTILIZED TO PREVENT FALLS:  Life alert? No  Use of a cane, walker or w/c? No  Grab bars in the bathroom? No  Shower chair or bench in shower? No  Elevated toilet seat or a handicapped toilet? No   TIMED UP AND GO:  Was the test performed? No . Telephonic visit  Cognitive Function:  11/03/2021    9:13 AM  6CIT Screen  What Year? 0 points  What month? 0 points  What time? 0 points  Count back from 20 0  points  Months in reverse 0 points  Repeat phrase 4 points  Total Score 4 points    Immunizations Immunization History  Administered Date(s) Administered   PFIZER(Purple Top)SARS-COV-2 Vaccination 05/20/2019, 06/17/2019   Td 10/09/2007, 09/07/2016    TDAP status: Up to date  Flu Vaccine status: Declined, Education has been provided regarding the importance of this vaccine but patient still declined. Advised may receive this vaccine at local pharmacy or Health Dept. Aware to provide a copy of the vaccination record if obtained from local pharmacy or Health Dept. Verbalized acceptance and understanding.  Pneumococcal vaccine status: Declined,  Education has been provided regarding the importance of this vaccine but patient still declined. Advised may receive this vaccine at local pharmacy or Health Dept. Aware to provide a copy of the vaccination record if obtained from local pharmacy or Health Dept. Verbalized acceptance and understanding.   Covid-19 vaccine status: Completed vaccines  Qualifies for Shingles Vaccine? Yes   Zostavax completed No   Shingrix Completed?: No.    Education has been provided regarding the importance of this vaccine. Patient has been advised to call insurance company to determine out of pocket expense if they have not yet received this vaccine. Advised may also receive vaccine at local pharmacy or Health Dept. Verbalized acceptance and understanding.  Screening Tests Health Maintenance  Topic Date Due   Pneumonia Vaccine 22+ Years old (1 - PCV) Never done   Zoster Vaccines- Shingrix (1 of 2) Never done   COLONOSCOPY (Pts 45-19yr Insurance coverage will need to be confirmed)  Never done   COVID-19 Vaccine (3 - Pfizer risk series) 07/15/2019   INFLUENZA VACCINE  10/18/2021   TETANUS/TDAP  09/08/2026   Hepatitis C Screening  Completed   HPV VACCINES  Aged Out    Health Maintenance  Health Maintenance Due  Topic Date Due   Pneumonia Vaccine 631+Years  old (1 - PCV) Never done   Zoster Vaccines- Shingrix (1 of 2) Never done   COLONOSCOPY (Pts 45-436yrInsurance coverage will need to be confirmed)  Never done   COVID-19 Vaccine (3 - Pfizer risk series) 07/15/2019   INFLUENZA VACCINE  10/18/2021    Colorectal Cancer Screening: He has a cologuard test at home that he needs to return - we discussed and he will try to remember  Lung Cancer Screening: (Low Dose CT Chest recommended if Age 67-80ears, 30 pack-year currently smoking OR have quit w/in 15years.) does not qualify.  Additional Screening:  Hepatitis C Screening: does qualify; Completed 06/03/2021  Vision Screening: Recommended annual ophthalmology exams for early detection of glaucoma and other disorders of the eye. Is the patient up to date with their annual eye exam?  No  Who is the provider or what is the name of the office in which the patient attends annual eye exams? none If pt is not established with a provider, would they like to be referred to a provider to establish care? No .   Dental Screening: Recommended annual dental exams for proper oral hygiene  Community Resource Referral / Chronic Care Management: CRR required this visit?  No   CCM required this visit?  No      Plan:     I have personally reviewed and noted the following in the patient's chart:   Medical and  social history Use of alcohol, tobacco or illicit drugs  Current medications and supplements including opioid prescriptions. Patient is not currently taking opioid prescriptions. Functional ability and status Nutritional status Physical activity Advanced directives List of other physicians Hospitalizations, surgeries, and ER visits in previous 12 months Vitals Screenings to include cognitive, depression, and falls Referrals and appointments  In addition, I have reviewed and discussed with patient certain preventive protocols, quality metrics, and best practice recommendations. A written  personalized care plan for preventive services as well as general preventive health recommendations were provided to patient.     Sandrea Hammond, LPN   [id number]   Nurse Notes: None

## 2021-11-11 ENCOUNTER — Encounter: Payer: Medicare Other | Admitting: Family Medicine

## 2022-11-08 ENCOUNTER — Ambulatory Visit (INDEPENDENT_AMBULATORY_CARE_PROVIDER_SITE_OTHER): Payer: Medicare Other

## 2022-11-08 VITALS — Ht 72.0 in | Wt 198.0 lb

## 2022-11-08 DIAGNOSIS — Z0000 Encounter for general adult medical examination without abnormal findings: Secondary | ICD-10-CM | POA: Diagnosis not present

## 2022-11-08 NOTE — Progress Notes (Signed)
Subjective:   Derrick York is a 67 y.o. male who presents for Medicare Annual/Subsequent preventive examination.  Visit Complete: Virtual  I connected with  Derrick York on 11/08/22 by a audio enabled telemedicine application and verified that I am speaking with the correct person using two identifiers.  Patient Location: Home  Provider Location: Office/Clinic  I discussed the limitations of evaluation and management by telemedicine. The patient  Vital Signs: Because this visit was a virtual/telehealth visit, some criteria may be missing or patient reported. Any vitals not documented were not able to be obtained and vitals that have been documented are patient reported.  expressed understanding and agreed to proceed.   Review of Systems      Cardiac Risk Factors include: advanced age (>84men, >72 women);male gender     Objective:    Today's Vitals   11/08/22 1344  Weight: 198 lb (89.8 kg)  Height: 6' (1.829 m)   Body mass index is 26.85 kg/m.     11/08/2022    1:51 PM 11/03/2021    9:11 AM 03/02/2020   10:51 AM 02/24/2020    1:22 PM 03/11/2014    7:13 AM 03/09/2014    9:54 AM  Advanced Directives  Does Patient Have a Medical Advance Directive? Yes No No No No No  Type of Estate agent of [place];Living will       Copy of Healthcare Power of Attorney in Chart? No - copy requested       Would patient like information on creating a medical advance directive?  No - Patient declined No - Patient declined No - Patient declined  No - patient declined information    Current Medications (verified) Outpatient Encounter Medications as of 11/08/2022  Medication Sig   acetaminophen (TYLENOL) 500 MG tablet Take 500 mg by mouth every 6 (six) hours as needed.   Multiple Vitamins-Minerals (MENS MULTIVITAMIN PO) Take by mouth daily.   No facility-administered encounter medications on file as of 11/08/2022.    Allergies (verified) Codeine    History: Past Medical History:  Diagnosis Date   Chronic hepatitis C (HCC) 05/15/2018   Followed by Annamarie Major, NP   History of alcohol abuse    History of hepatitis C 2020   had 12 week treatment and does not have anymore   History of migraine    Past Surgical History:  Procedure Laterality Date   INGUINAL HERNIA REPAIR Right 03/11/2014   Procedure: RIGHT INGUINAL HERNIA REPAIR ;  Surgeon: Harriette Bouillon, MD;  Location: [facility];  Service: General;  Laterality: Right;   INGUINAL HERNIA REPAIR Left 03/02/2020   Procedure: LEFT INGUINAL HERNIA REPAIR;  Surgeon: Harriette Bouillon, MD;  Location: [facility];  Service: General;  Laterality: Left;   INSERTION OF MESH Right 03/11/2014   Procedure: INSERTION OF MESH;  Surgeon: Harriette Bouillon, MD;  Location: [facility];  Service: General;  Laterality: Right;   INSERTION OF MESH Left 03/02/2020   Procedure: INSERTION OF MESH;  Surgeon: Harriette Bouillon, MD;  Location:  [facility];  Service: General;  Laterality: Left;   KNEE ARTHROSCOPY  1995   right   Family History  Problem Relation Age of Onset   Early death Mother    Hypertension Mother    Liver cancer Mother    Lung cancer Mother 68   Heart attack Father 66   Alcohol abuse Father    Alcohol abuse Brother    Drug  abuse Brother    Asthma Son    Asthma Son    Healthy Maternal Grandmother    Alcohol abuse Maternal Grandfather 82   Dementia Paternal Grandmother    Aneurysm Paternal Grandfather 62   Social History   Socioeconomic History   Marital status: Married    Spouse name: Marylene Land   Number of children: 4   Years of education: BFA in Dance   Highest education level: Not on file  Occupational History   Not on file  Tobacco Use   Smoking status: Former    Current packs/day: 0.00    Average packs/day: 0.5 packs/day for 5.0 years (2.5 ttl pk-yrs)    Types: Cigarettes    Start date: 9    Quit date: 1980     Years since quitting: 44.6   Smokeless tobacco: Never  Vaping Use   Vaping status: Never Used  Substance and Sexual Activity   Alcohol use: No    Comment: not since 1980-age 49 he quit   Drug use: Not Currently    Comment: no needles, and not since 1980   Sexual activity: Yes    Birth control/protection: None    Comment: pull out method  Other Topics Concern   Not on file  Social History Narrative   Works at [employer] - so occasionally difficult in the summer/winter   Lives with [place]    2 - Sadie and Franky Macho - teenagers   2 older children - Ace Gins and Gabriel Rung - live out of state   Enjoys: playing music, enjoys biking as well   Exercise: lifting light weights, walking   Diet: vegetarian, recently gave up sugar, watches for protein   Social Determinants of Health   Financial Resource Strain: Low Risk  (11/08/2022)   Overall Financial Resource Strain (CARDIA)    Difficulty of Paying Living Expenses: Not hard at all  Food Insecurity: No Food Insecurity (11/08/2022)   Hunger Vital Sign    Worried About Running Out of Food in the Last Year: Never true    Ran Out of Food in the Last Year: Never true  Transportation Needs: No Transportation Needs (11/08/2022)   PRAPARE - Administrator, Civil Service (Medical): No    Lack of Transportation (Non-Medical): No  Physical Activity: Sufficiently Active (11/08/2022)   Exercise Vital Sign    Days of Exercise per Week: 7 days    Minutes of Exercise per Session: 30 min  Stress: No Stress Concern Present (11/08/2022)   [employer] of Occupational Health - Occupational Stress Questionnaire    Feeling of Stress : Not at all  Social Connections: Socially Integrated (11/08/2022)   Social Connection and Isolation Panel [NHANES]    Frequency of Communication with Friends and Family: More than three times a week    Frequency of Social Gatherings with Friends and Family: More than three times a week    Attends Religious Services: More than 4 times  per year    Active Member of [employer] or Organizations: Yes    Attends Engineer, structural: More than 4 times per year    Marital Status: Married    Tobacco Counseling Counseling given: Not Answered   Clinical Intake:  Pre-visit preparation completed: Yes  Pain : No/denies pain     BMI - recorded: 26.85 Nutritional Status: BMI 25 -29 Overweight Nutritional Risks: None Diabetes: No  How often do you need to have someone help you when you read instructions, pamphlets, or other written  materials from your doctor or pharmacy?: 1 - Never  Interpreter Needed?: No  Information entered by :: C.Heath Tesler LPN   Activities of Daily Living    11/08/2022    1:52 PM  In your present state of health, do you have any difficulty performing the following activities:  Hearing? 1  Comment slight hearing loss due to loud music  Vision? 0  Difficulty concentrating or making decisions? 1  Comment occasionally forgets  Walking or climbing stairs? 0  Dressing or bathing? 0  Doing errands, shopping? 0  Preparing Food and eating ? N  Using the Toilet? N  In the past six months, have you accidently leaked urine? N  Do you have problems with loss of bowel control? N  Managing your Medications? N  Managing your Finances? N  Housekeeping or managing your Housekeeping? N    Patient Care Team: Gweneth Dimitri, MD as PCP - General (Family Medicine) Annamarie Major, CRNP as Consulting Physician (Nurse Practitioner)  Indicate any recent Medical Services you may have received from other than [facility] providers in the past year (date may be approximate).     Assessment:   This is a routine wellness examination for Derrick York.  Hearing/Vision screen Hearing Screening - Comments:: Has slight hearing loss  Vision Screening - Comments:: Readers - Unknown Provider - Pt will call for eye exam  Dietary issues and exercise activities discussed:     Goals Addressed             This Visit's  Progress    Patient Stated       Maintain health and lose 10 pounds       Depression Screen    11/08/2022    1:46 PM 11/03/2021    9:10 AM 06/02/2019    3:34 PM 05/09/2018    3:42 PM  PHQ 2/9 Scores  PHQ - 2 Score 0 0 0 0  PHQ- 9 Score    3    Fall Risk    11/08/2022    1:49 PM 11/03/2021    9:07 AM  Fall Risk   Falls in the past year? 0 0  Number falls in past yr: 0 0  Injury with Fall? 0 0  Risk for fall due to : No Fall Risks Orthopedic patient  Follow up Falls prevention discussed;Falls evaluation completed Falls prevention discussed    MEDICARE RISK AT HOME: Medicare Risk at Home Any stairs in or around the home?: Yes If so, are there any without handrails?: No Home free of loose throw rugs in walkways, pet beds, electrical cords, etc?: Yes Adequate lighting in your home to reduce risk of falls?: Yes Life alert?: No Use of a cane, walker or w/c?: No Grab bars in the bathroom?: No Shower chair or bench in shower?: No Elevated toilet seat or a handicapped toilet?: No  TIMED UP AND GO:  Was the test performed?  No    Cognitive Function:        11/08/2022    1:54 PM 11/03/2021    9:13 AM  6CIT Screen  What Year? 0 points 0 points  What month? 0 points 0 points  What time? 0 points 0 points  Count back from 20 0 points 0 points  Months in reverse 0 points 0 points  Repeat phrase 0 points 4 points  Total Score 0 points 4 points    Immunizations Immunization History  Administered Date(s) Administered   PFIZER(Purple Top)SARS-COV-2 Vaccination 05/20/2019, 06/17/2019   Td 10/09/2007,  09/07/2016    TDAP status: Up to date  Flu Vaccine status: Due, Education has been provided regarding the importance of this vaccine. Advised may receive this vaccine at local pharmacy or Health Dept. Aware to provide a copy of the vaccination record if obtained from local pharmacy or Health Dept. Verbalized acceptance and understanding.  Pneumococcal vaccine status: Due,  Education has been provided regarding the importance of this vaccine. Advised may receive this vaccine at local pharmacy or Health Dept. Aware to provide a copy of the vaccination record if obtained from local pharmacy or Health Dept. Verbalized acceptance and understanding.  Covid-19 vaccine status: Information provided on how to obtain vaccines.   Qualifies for Shingles Vaccine? Yes   Zostavax completed      Shingrix Completed?: No.    Education has been provided regarding the importance of this vaccine. Patient has been advised to call insurance company to determine out of pocket expense if they have not yet received this vaccine. Advised may also receive vaccine at local pharmacy or Health Dept. Verbalized acceptance and understanding.  Screening Tests Health Maintenance  Topic Date Due   Zoster Vaccines- Shingrix (1 of 2) Never done   Colonoscopy  Never done   COVID-19 Vaccine (3 - Pfizer risk series) 07/15/2019   Pneumonia Vaccine 23+ Years old (1 of 1 - PCV) Never done   INFLUENZA VACCINE  10/19/2022   Medicare Annual Wellness (AWV)  11/08/2023   DTaP/Tdap/Td (3 - Tdap) 09/08/2026   Hepatitis C Screening  Completed   HPV VACCINES  Aged Out    Health Maintenance  Health Maintenance Due  Topic Date Due   Zoster Vaccines- Shingrix (1 of 2) Never done   Colonoscopy  Never done   COVID-19 Vaccine (3 - Pfizer risk series) 07/15/2019   Pneumonia Vaccine 61+ Years old (1 of 1 - PCV) Never done   INFLUENZA VACCINE  10/19/2022    Colonoscopy- Pt declined. Would like to talk with PCP re cologuard.  Lung Cancer Screening: (Low Dose CT Chest recommended if Age 75-80 years, 20 pack-year currently smoking OR have quit w/in 15years.) does not qualify.   Lung Cancer Screening Referral:    Additional Screening:  Hepatitis C Screening: does qualify; Completed 05/09/18  Vision Screening: Recommended annual ophthalmology exams for early detection of glaucoma and other disorders of the  eye. Is the patient up to date with their annual eye exam?  No  pt stated he will call for appointment Who is the provider or what is the name of the office in which the patient attends annual eye exams? UNKNOWN provider If pt is not established with a provider, would they like to be referred to a provider to establish care? Yes .   Dental Screening: Recommended annual dental exams for proper oral hygiene   Community Resource Referral / Chronic Care Management: CRR required this visit?  No   CCM required this visit?  No     Plan:     I have personally reviewed and noted the following in the patient's chart:   Medical and social history Use of alcohol, tobacco or illicit drugs  Current medications and supplements including opioid prescriptions. Patient is not currently taking opioid prescriptions. Functional ability and status Nutritional status Physical activity Advanced directives List of other physicians Hospitalizations, surgeries, and ER visits in previous 12 months Vitals Screenings to include cognitive, depression, and falls Referrals and appointments  In addition, I have reviewed and discussed with patient certain preventive protocols,  quality metrics, and best practice recommendations. A written personalized care plan for preventive services as well as general preventive health recommendations were provided to patient.     Maryan Puls, LPN   06/19/270   After Visit Summary: (Declined) Due to this being a telephonic visit, with patients personalized plan was offered to patient but patient Declined AVS at this time   Nurse Notes: Pt declined colonoscopy.

## 2022-11-08 NOTE — Patient Instructions (Signed)
Mr. Heemstra , Thank you for taking time to come for your Medicare Wellness Visit. I appreciate your ongoing commitment to your health goals. Please review the following plan we discussed and let me know if I can assist you in the future.   Referrals/Orders/Follow-Ups/Clinician Recommendations: Aim for 30 minutes of exercise or brisk walking, 6-8 glasses of water, and 5 servings of fruits and vegetables each day.   This is a list of the screening recommended for you and due dates:  Health Maintenance  Topic Date Due   Zoster (Shingles) Vaccine (1 of 2) Never done   Colon Cancer Screening  Never done   COVID-19 Vaccine (3 - Pfizer risk series) 07/15/2019   Pneumonia Vaccine (1 of 1 - PCV) Never done   Medicare Annual Wellness Visit  11/04/2022   Flu Shot  10/19/2022   DTaP/Tdap/Td vaccine (3 - Tdap) 09/08/2026   Hepatitis C Screening  Completed   HPV Vaccine  Aged Out    Advanced directives: (Copy Requested) Please bring a copy of your health care power of attorney and living will to the office to be added to your chart at your convenience.  Next Medicare Annual Wellness Visit scheduled for next year: Yes

## 2023-01-02 ENCOUNTER — Encounter: Payer: Medicare Other | Admitting: Primary Care

## 2023-01-11 ENCOUNTER — Ambulatory Visit: Payer: Medicare Other | Admitting: Primary Care

## 2023-01-11 ENCOUNTER — Encounter: Payer: Self-pay | Admitting: Primary Care

## 2023-01-11 VITALS — BP 120/70 | HR 75 | Temp 97.9°F | Ht 70.0 in | Wt 191.0 lb

## 2023-01-11 DIAGNOSIS — Z125 Encounter for screening for malignant neoplasm of prostate: Secondary | ICD-10-CM | POA: Diagnosis not present

## 2023-01-11 DIAGNOSIS — Z1211 Encounter for screening for malignant neoplasm of colon: Secondary | ICD-10-CM

## 2023-01-11 DIAGNOSIS — Z13220 Encounter for screening for lipoid disorders: Secondary | ICD-10-CM | POA: Diagnosis not present

## 2023-01-11 DIAGNOSIS — G479 Sleep disorder, unspecified: Secondary | ICD-10-CM | POA: Insufficient documentation

## 2023-01-11 DIAGNOSIS — Z8619 Personal history of other infectious and parasitic diseases: Secondary | ICD-10-CM | POA: Diagnosis not present

## 2023-01-11 LAB — COMPREHENSIVE METABOLIC PANEL
ALT: 24 U/L (ref 0–53)
AST: 29 U/L (ref 0–37)
Albumin: 4.5 g/dL (ref 3.5–5.2)
Alkaline Phosphatase: 78 U/L (ref 39–117)
BUN: 17 mg/dL (ref 6–23)
CO2: 28 meq/L (ref 19–32)
Calcium: 9.6 mg/dL (ref 8.4–10.5)
Chloride: 102 meq/L (ref 96–112)
Creatinine, Ser: 0.73 mg/dL (ref 0.40–1.50)
GFR: 93.55 mL/min (ref >60.00)
Glucose, Bld: 89 mg/dL (ref 70–99)
Potassium: 4.2 meq/L (ref 3.5–5.1)
Sodium: 137 meq/L (ref 135–145)
Total Bilirubin: 0.8 mg/dL (ref 0.2–1.2)
Total Protein: 7.4 g/dL (ref 6.0–8.3)

## 2023-01-11 LAB — CBC
HCT: 45.4 % (ref 39.0–52.0)
Hemoglobin: 14.9 g/dL (ref 13.0–17.0)
MCHC: 32.8 g/dL (ref 30.0–36.0)
MCV: 91.4 fL (ref 78.0–100.0)
Platelets: 261 10*3/uL (ref 150.0–400.0)
RBC: 4.97 Mil/uL (ref 4.22–5.81)
RDW: 12.7 % (ref 11.5–15.5)
WBC: 6.9 10*3/uL (ref 4.0–10.5)

## 2023-01-11 LAB — LIPID PANEL
Cholesterol: 158 mg/dL (ref 0–200)
HDL: 42 mg/dL (ref >39.00)
LDL Cholesterol: 90 mg/dL (ref 0–99)
NonHDL: 116.27
Total CHOL/HDL Ratio: 4
Triglycerides: 131 mg/dL (ref 0.0–149.0)
VLDL: 26.2 mg/dL (ref 0.0–40.0)

## 2023-01-11 LAB — PSA: PSA: 1.8 ng/mL (ref 0.10–4.00)

## 2023-01-11 NOTE — Assessment & Plan Note (Signed)
Overall controlled.  Long discussion today regarding healthy sleep hygiene such as avoiding screen time 1 hour prior to bed, limiting caffeine after 12 pm, reading, etc. Continue supplements.

## 2023-01-11 NOTE — Patient Instructions (Signed)
Stop by the lab prior to leaving today. I will notify you of your results once received.   It was a pleasure meeting you!  

## 2023-01-11 NOTE — Progress Notes (Signed)
Subjective:    Patient ID: Derrick York, male    DOB: 1955/10/21, 67 y.o.   MRN: [id number]  HPI  Derrick York is a very pleasant 67 y.o. male patient of Dr. Selena Batten with a history of liver fibrosis, inguinal hernia who presents today to transfer care from Dr. Selena Batten.  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   1) History of Hepatitis C: Treated in 2022 with cure. History of 9 mm hyperchoic mass to the liver. He last underwent an ultrasound in May 2023 which was stable compared to prior years. History of alcohol abuse, has been sober for years.   2) Sleep Disturbance: Sometimes difficulty falling asleep mostly. He is currently managed on CBD, magnesium, and zinc supplements which help most nights.   When he cannot sleep he will eat peanut butter and honey. He exercises 6 days weekly for about 30 minutes. He often looks at a TV before bedtime. He drinks 3 cups of black tea during the days he works, 1 cup of tea on his days off. Mostly he stops caffeine by 12 pm.   3) Family History of CAD: In father who had a heart attack. He denies chest pain, shortness of breath. His last lipid panel was in 2020 with LDL of 70.   BP Readings from Last 3 Encounters:  01/11/23 120/70  06/03/21 108/60  03/02/20 137/82     Review of Systems  Respiratory:  Negative for shortness of breath.   Cardiovascular:  Negative for chest pain.  Gastrointestinal:  Negative for constipation and diarrhea.  Neurological:  Negative for dizziness and headaches.  Psychiatric/Behavioral:  Positive for sleep disturbance. The patient is not nervous/anxious.          Past Medical History:  Diagnosis Date   Chronic hepatitis C (HCC) 05/15/2018   Followed by Annamarie Major, NP   History of alcohol abuse    History of hepatitis C 2020   had 12 week treatment and does not have anymore   History of migraine    Left inguinal hernia 06/02/2019   Right  knee pain 05/09/2018    Social History   Socioeconomic History   Marital status: Married    Spouse name: Marylene Land   Number of children: 4   Years of education: BFA in Dance   Highest education level: Not on file  Occupational History   Not on file  Tobacco Use   Smoking status: Former    Current packs/day: 0.00    Average packs/day: 0.5 packs/day for 5.0 years (2.5 ttl pk-yrs)    Types: Cigarettes    Start date: 29    Quit date: 1980    Years since quitting: 44.8   Smokeless tobacco: Never  Vaping Use   Vaping status: Never Used  Substance and Sexual Activity   Alcohol use: No    Comment: not since 1980-age 30 he quit   Drug use: Not Currently    Comment: no needles, and not since 1980   Sexual activity: Yes    Birth control/protection: None    Comment: pull out method  Other Topics Concern   Not on file  Social History Narrative   Works at [employer] - so occasionally difficult in the summer/winter   Lives with Marylene Land    2 - Sadie and Franky Macho - teenagers   2 older children - Ace Gins and Gabriel Rung - live out of state  Enjoys: playing music, enjoys biking as well   Exercise: lifting light weights, walking   Diet: vegetarian, recently gave up sugar, watches for protein   Social Determinants of Health   Financial Resource Strain: Low Risk  (11/08/2022)   Overall Financial Resource Strain (CARDIA)    Difficulty of Paying Living Expenses: Not hard at all  Food Insecurity: No Food Insecurity (11/08/2022)   Hunger Vital Sign    Worried About Running Out of Food in the Last Year: Never true    Ran Out of Food in the Last Year: Never true  Transportation Needs: No Transportation Needs (11/08/2022)   PRAPARE - Administrator, Civil Service (Medical): No    Lack of Transportation (Non-Medical): No  Physical Activity: Sufficiently Active (11/08/2022)   Exercise Vital Sign    Days of Exercise per Week: 7 days    Minutes of Exercise per Session: 30 min  Stress: No Stress Concern  Present (11/08/2022)   [employer] of Occupational Health - Occupational Stress Questionnaire    Feeling of Stress : Not at all  Social Connections: Socially Integrated (11/08/2022)   Social Connection and Isolation Panel [NHANES]    Frequency of Communication with Friends and Family: More than three times a week    Frequency of Social Gatherings with Friends and Family: More than three times a week    Attends Religious Services: More than 4 times per year    Active Member of Clubs or Organizations: Yes    Attends Banker Meetings: More than 4 times per year    Marital Status: Married  Catering manager Violence: Not At Risk (11/08/2022)   Humiliation, Afraid, Rape, and Kick questionnaire    Fear of Current or Ex-Partner: No    Emotionally Abused: No    Physically Abused: No    Sexually Abused: No    Past Surgical History:  Procedure Laterality Date   INGUINAL HERNIA REPAIR Right 03/11/2014   Procedure: RIGHT INGUINAL HERNIA REPAIR ;  Surgeon: Harriette Bouillon, MD;  Location: [facility];  Service: General;  Laterality: Right;   INGUINAL HERNIA REPAIR Left 03/02/2020   Procedure: LEFT INGUINAL HERNIA REPAIR;  Surgeon: Harriette Bouillon, MD;  Location: [facility];  Service: General;  Laterality: Left;   INSERTION OF MESH Right 03/11/2014   Procedure: INSERTION OF MESH;  Surgeon: Harriette Bouillon, MD;  Location: [facility];  Service: General;  Laterality: Right;   INSERTION OF MESH Left 03/02/2020   Procedure: INSERTION OF MESH;  Surgeon: Harriette Bouillon, MD;  Location: [facility];  Service: General;  Laterality: Left;   KNEE ARTHROSCOPY  1995   right    Family History  Problem Relation Age of Onset   York death Mother    Hypertension Mother    Liver cancer Mother    Lung cancer Mother 71   Heart attack Father 31   Alcohol abuse Father    Alcohol abuse Brother    Drug abuse Brother    Asthma Son    Asthma  Son    Healthy Maternal Grandmother    Alcohol abuse Maternal Grandfather 34   Dementia Paternal Grandmother    Aneurysm Paternal Grandfather 63    Allergies  Allergen Reactions   Codeine Nausea And Vomiting    Current Outpatient Medications on File Prior to Visit  Medication Sig Dispense Refill   Multiple Vitamins-Minerals (MENS MULTIVITAMIN PO) Take by mouth daily.     No  current facility-administered medications on file prior to visit.    BP 120/70 (BP Location: Left Arm, Patient Position: Sitting, Cuff Size: Normal)   Pulse 75   Temp 97.9 F (36.6 C) (Oral)   Ht 5\' 10"  (1.778 m)   Wt 191 lb (86.6 kg)   SpO2 96%   BMI 27.41 kg/m  Objective:   Physical Exam Cardiovascular:     Rate and Rhythm: Normal rate and regular rhythm.  Pulmonary:     Effort: Pulmonary effort is normal.     Breath sounds: Normal breath sounds.  Musculoskeletal:     Cervical back: Neck supple.  Skin:    General: Skin is warm and dry.  Neurological:     Mental Status: He is alert and oriented to person, place, and time.  Psychiatric:        Mood and Affect: Mood normal.           Assessment & Plan:  Sleep disturbance Assessment & Plan: Overall controlled.  Long discussion today regarding healthy sleep hygiene such as avoiding screen time 1 hour prior to bed, limiting caffeine after 12 pm, reading, etc. Continue supplements.    Orders: -     Comprehensive metabolic panel -     CBC  History of hepatitis C Assessment & Plan: Reviewed office notes and labs from 2022 and 2023 through Care Everywhere. Repeat liver enzymes pending.  Orders: -     Comprehensive metabolic panel -     CBC  Screening for prostate cancer -     PSA  Screening for hyperlipidemia -     Lipid panel  Screening for colon cancer -     Ambulatory referral to Gastroenterology        Doreene Nest, NP

## 2023-01-11 NOTE — Assessment & Plan Note (Addendum)
Reviewed office notes and labs from 2022 and 2023 through Care Everywhere. Repeat liver enzymes pending.

## 2023-01-16 ENCOUNTER — Ambulatory Visit (INDEPENDENT_AMBULATORY_CARE_PROVIDER_SITE_OTHER): Payer: Medicare Other

## 2023-01-16 DIAGNOSIS — Z111 Encounter for screening for respiratory tuberculosis: Secondary | ICD-10-CM | POA: Diagnosis not present

## 2023-01-16 NOTE — Progress Notes (Signed)
PPD Placement note Derrick York, 67 y.o. male is here today for placement of PPD test Reason for PPD test: Needed for work Pt taken PPD test before: yes Verified in allergy area and with patient that they are not allergic to the products PPD is made of (Phenol or Tween). Yes Is patient taking any oral or IV steroid medication now or have they taken it in the last month? no Has the patient ever received the BCG vaccine?: no Has the patient been in recent contact with anyone known or suspected of having active TB disease?: no      Date of exposure (if applicable): N/A      Name of person they were exposed to (if applicable): N/A Patient's Country of origin?: [place] States O: Alert and oriented in NAD. P:  PPD placed on 01/16/2023.  Patient advised to return for reading within 48-72 hours.

## 2023-01-18 ENCOUNTER — Ambulatory Visit (INDEPENDENT_AMBULATORY_CARE_PROVIDER_SITE_OTHER): Payer: Medicare Other

## 2023-01-18 DIAGNOSIS — Z111 Encounter for screening for respiratory tuberculosis: Secondary | ICD-10-CM | POA: Diagnosis not present

## 2023-01-18 LAB — TB SKIN TEST
Induration: 0 mm
TB Skin Test: NEGATIVE

## 2023-01-18 NOTE — Progress Notes (Signed)
PPD Reading Note  PPD read and results entered in EpicCare.  Result: 0 mm induration.  Interpretation: negative  If test not read within 48-72 hours of initial placement, patient advised to repeat in other arm 1-3 weeks after this test.  Allergic reaction: no

## 2023-01-22 ENCOUNTER — Encounter: Payer: Self-pay | Admitting: *Deleted

## 2023-01-25 ENCOUNTER — Ambulatory Visit: Payer: Medicare Other | Admitting: Primary Care

## 2023-01-30 ENCOUNTER — Encounter: Payer: Self-pay | Admitting: Primary Care

## 2023-01-30 ENCOUNTER — Ambulatory Visit (INDEPENDENT_AMBULATORY_CARE_PROVIDER_SITE_OTHER): Payer: Medicare Other | Admitting: Primary Care

## 2023-01-30 VITALS — BP 114/62 | HR 82 | Temp 98.1°F | Ht 70.0 in | Wt 193.0 lb

## 2023-01-30 DIAGNOSIS — H6120 Impacted cerumen, unspecified ear: Secondary | ICD-10-CM | POA: Insufficient documentation

## 2023-01-30 DIAGNOSIS — Z1211 Encounter for screening for malignant neoplasm of colon: Secondary | ICD-10-CM

## 2023-01-30 DIAGNOSIS — H6121 Impacted cerumen, right ear: Secondary | ICD-10-CM | POA: Diagnosis not present

## 2023-01-30 NOTE — Assessment & Plan Note (Signed)
Exam today with right cerumen impaction.  Patient consented to irrigation of canals bilaterally.  Right canal irrigated. Patient tolerated well. TM and canal post irrigation unremarkable.   Discussed home care instructions.

## 2023-01-30 NOTE — Patient Instructions (Signed)
Avoid putting anything into your ears including tissue, Q-tips.  Use caution when using earbuds.  It was a pleasure to see you today!

## 2023-01-30 NOTE — Progress Notes (Signed)
Subjective:    Patient ID: Derrick York, male    DOB: 1955-11-28, 67 y.o.   MRN: [id number]  Ear Fullness  Pertinent negatives include no headaches.    Derrick York is a very pleasant 67 y.o. male who presents today to discuss ear fullness.  His fullness is located to the left ear which began a couple of months ago. He denies pain, rhinorrhea, post nasal drip. He has a history of cerumen impaction, believes that he has an impaction now.   He would also like his colonoscopy location changed from [place] [facility] to the [place] area.  Review of Systems  HENT:  Negative for congestion, ear pain, postnasal drip and sinus pressure.        Ear fullness  Neurological:  Negative for headaches.         Past Medical History:  Diagnosis Date   Chronic hepatitis C (HCC) 05/15/2018   Followed by Annamarie Major, NP   History of alcohol abuse    History of hepatitis C 2020   had 12 week treatment and does not have anymore   History of migraine    Left inguinal hernia 06/02/2019   Right knee pain 05/09/2018    Social History   Socioeconomic History   Marital status: Married    Spouse name: Derrick York   Number of children: 4   Years of education: BFA in Dance   Highest education level: Not on file  Occupational History   Not on file  Tobacco Use   Smoking status: Former    Current packs/day: 0.00    Average packs/day: 0.5 packs/day for 5.0 years (2.5 ttl pk-yrs)    Types: Cigarettes    Start date: 25    Quit date: 1980    Years since quitting: 44.8   Smokeless tobacco: Never  Vaping Use   Vaping status: Never Used  Substance and Sexual Activity   Alcohol use: No    Comment: not since 1980-age 29 he quit   Drug use: Not Currently    Comment: no needles, and not since 1980   Sexual activity: Yes    Birth control/protection: None    Comment: pull out method  Other Topics Concern   Not on file  Social History Narrative   Works at [employer] - so occasionally  difficult in the summer/winter   Lives with [place]    2 - Sadie and Franky Macho - teenagers   2 older children - Ace Gins and Gabriel Rung - live out of state   Enjoys: playing music, enjoys biking as well   Exercise: lifting light weights, walking   Diet: vegetarian, recently gave up sugar, watches for protein   Social Determinants of Health   Financial Resource Strain: Low Risk  (11/08/2022)   Overall Financial Resource Strain (CARDIA)    Difficulty of Paying Living Expenses: Not hard at all  Food Insecurity: No Food Insecurity (11/08/2022)   Hunger Vital Sign    Worried About Running Out of Food in the Last Year: Never true    Ran Out of Food in the Last Year: Never true  Transportation Needs: No Transportation Needs (11/08/2022)   PRAPARE - Administrator, Civil Service (Medical): No    Lack of Transportation (Non-Medical): No  Physical Activity: Sufficiently Active (11/08/2022)   Exercise Vital Sign    Days of Exercise per Week: 7 days    Minutes of Exercise per Session: 30 min  Stress: No Stress Concern Present (11/08/2022)  [employer] of Occupational Health - Occupational Stress Questionnaire    Feeling of Stress : Not at all  Social Connections: Socially Integrated (11/08/2022)   Social Connection and Isolation Panel [NHANES]    Frequency of Communication with Friends and Family: More than three times a week    Frequency of Social Gatherings with Friends and Family: More than three times a week    Attends Religious Services: More than 4 times per year    Active Member of Clubs or Organizations: Yes    Attends Banker Meetings: More than 4 times per year    Marital Status: Married  Catering manager Violence: Not At Risk (11/08/2022)   Humiliation, Afraid, Rape, and Kick questionnaire    Fear of Current or Ex-Partner: No    Emotionally Abused: No    Physically Abused: No    Sexually Abused: No    Past Surgical History:  Procedure Laterality Date   INGUINAL  HERNIA REPAIR Right 03/11/2014   Procedure: RIGHT INGUINAL HERNIA REPAIR ;  Surgeon: Harriette Bouillon, MD;  Location: [facility];  Service: General;  Laterality: Right;   INGUINAL HERNIA REPAIR Left 03/02/2020   Procedure: LEFT INGUINAL HERNIA REPAIR;  Surgeon: Harriette Bouillon, MD;  Location: [facility];  Service: General;  Laterality: Left;   INSERTION OF MESH Right 03/11/2014   Procedure: INSERTION OF MESH;  Surgeon: Harriette Bouillon, MD;  Location: [facility];  Service: General;  Laterality: Right;   INSERTION OF MESH Left 03/02/2020   Procedure: INSERTION OF MESH;  Surgeon: Harriette Bouillon, MD;  Location: [facility];  Service: General;  Laterality: Left;   KNEE ARTHROSCOPY  1995   right    Family History  Problem Relation Age of Onset   Early death Mother    Hypertension Mother    Liver cancer Mother    Lung cancer Mother 58   Heart attack Father 67   Alcohol abuse Father    Alcohol abuse Brother    Drug abuse Brother    Asthma Son    Asthma Son    Healthy Maternal Grandmother    Alcohol abuse Maternal Grandfather 93   Dementia Paternal Grandmother    Aneurysm Paternal Grandfather 65    Allergies  Allergen Reactions   Codeine Nausea And Vomiting    Current Outpatient Medications on File Prior to Visit  Medication Sig Dispense Refill   Multiple Vitamins-Minerals (MENS MULTIVITAMIN PO) Take by mouth daily.     No current facility-administered medications on file prior to visit.    BP 114/62 (BP Location: Left Arm, Patient Position: Sitting, Cuff Size: Normal)   Pulse 82   Temp 98.1 F (36.7 C) (Oral)   Ht 5\' 10"  (1.778 m)   Wt 193 lb (87.5 kg)   SpO2 96%   BMI 27.69 kg/m  Objective:   Physical Exam Constitutional:      General: He is not in acute distress. HENT:     Right Ear: There is impacted cerumen.     Left Ear: Tympanic membrane and ear canal normal.  Cardiovascular:     Rate and Rhythm: Normal  rate.  Pulmonary:     Effort: Pulmonary effort is normal.  Neurological:     Mental Status: He is alert.           Assessment & Plan:  Impacted cerumen of right ear Assessment & Plan: Exam today with right cerumen impaction.  Patient consented to irrigation of  canals bilaterally.  Right canal irrigated. Patient tolerated well. TM and canal post irrigation unremarkable.   Discussed home care instructions.     Screening for colon cancer -     Ambulatory referral to Gastroenterology        Doreene Nest, NP

## 2023-02-08 ENCOUNTER — Telehealth: Payer: Self-pay | Admitting: Primary Care

## 2023-02-08 DIAGNOSIS — H2513 Age-related nuclear cataract, bilateral: Secondary | ICD-10-CM | POA: Diagnosis not present

## 2023-02-08 DIAGNOSIS — H43393 Other vitreous opacities, bilateral: Secondary | ICD-10-CM | POA: Diagnosis not present

## 2023-02-08 NOTE — Telephone Encounter (Signed)
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

## 2023-02-08 NOTE — Telephone Encounter (Signed)
Patient notified form is ready for pickup. Form has been placed with reception.

## 2023-02-08 NOTE — Telephone Encounter (Signed)
Completed and placed in Derrick York's inbox. 

## 2023-07-30 ENCOUNTER — Telehealth: Payer: Self-pay

## 2023-07-30 NOTE — Telephone Encounter (Signed)
 Called patient to follow up with care gaps. He has open colorectal screening gap. Patient does not remember is he still has cologuard kit. If not he would like to move forward and have colonoscopy. Patient will return call to office and let us  know if referral to GI is needed what office he would prefer.

## 2023-08-02 NOTE — Telephone Encounter (Signed)
Unable to reach patient.Unable to leave message, mailbox full.

## 2023-08-06 NOTE — Telephone Encounter (Signed)
Unable to reach patient.Unable to leave message, mailbox full.

## 2023-08-07 NOTE — Telephone Encounter (Signed)
 Unable to reach patient. Unable to leave message, mailbox full.  3rd attempt will await for patient to reach back out to office.

## 2024-01-07 ENCOUNTER — Telehealth: Payer: Self-pay

## 2024-01-07 NOTE — Telephone Encounter (Signed)
 Called and spoke with patient, answered all questions. Advised I see no active order for cologuard in our system and that GI probably ordered this for him. Scheduled CPE while on phone.

## 2024-01-07 NOTE — Telephone Encounter (Signed)
 Copied from CRM #[id number]. Topic: Clinical - Medical Advice >> Jan 07, 2024  2:36 PM Dedra B wrote: Reason for CRM: Pt received a cologuard kit a couple months ago and wants to know if it's still okay for him to use and send in. Pls call pt.

## 2024-01-18 ENCOUNTER — Ambulatory Visit (INDEPENDENT_AMBULATORY_CARE_PROVIDER_SITE_OTHER)

## 2024-01-18 ENCOUNTER — Encounter

## 2024-01-18 VITALS — Ht 70.0 in | Wt 193.0 lb

## 2024-01-18 DIAGNOSIS — Z0000 Encounter for general adult medical examination without abnormal findings: Secondary | ICD-10-CM

## 2024-01-18 NOTE — Progress Notes (Signed)
 Subjective:  Please attest and cosign this visit due to patients primary care provider not being in the office at the time the visit was completed.  (Pt of Dr Comer Gaskins)   Vicenta BIRCH Henriques is a 68 y.o. who presents for a Medicare Wellness preventive visit.  As a reminder, Annual Wellness Visits don't include a physical exam, and some assessments may be limited, especially if this visit is performed virtually. We may recommend an in-person follow-up visit with your provider if needed.  Visit Complete: Virtual I connected with  Saylor Sheckler Lona on 01/18/24 by a audio enabled telemedicine application and verified that I am speaking with the correct person using two identifiers.  Patient Location: Home  Provider Location: Office/Clinic  I discussed the limitations of evaluation and management by telemedicine. The patient expressed understanding and agreed to proceed.  Vital Signs: Because this visit was a virtual/telehealth visit, some criteria may be missing or patient reported. Any vitals not documented were not able to be obtained and vitals that have been documented are patient reported.  VideoDeclined- This patient declined Librarian, academic. Therefore the visit was completed with audio only.  Persons Participating in Visit: Patient.  AWV Questionnaire: No: Patient Medicare AWV questionnaire was not completed prior to this visit.  Cardiac Risk Factors include: advanced age (>74men, >35 women);male gender     Objective:    Today's Vitals   01/18/24 1315  Weight: 193 lb (87.5 kg)  Height: 5' 10 (1.778 m)   Body mass index is 27.69 kg/m.     01/18/2024    1:29 PM 11/08/2022    1:51 PM 11/03/2021    9:11 AM 03/02/2020   10:51 AM 02/24/2020    1:22 PM 03/11/2014    7:13 AM 03/09/2014    9:54 AM  Advanced Directives  Does Patient Have a Medical Advance Directive? Yes Yes No No No No  No   Type of Estate Agent of  [place];Living will Healthcare Power of [place];Living will       Copy of Healthcare Power of Attorney in Chart? No - copy requested No - copy requested       Would patient like information on creating a medical advance directive?   No - Patient declined No - Patient declined No - Patient declined  No - patient declined information      Data saved with a previous flowsheet row definition    Current Medications (verified) Outpatient Encounter Medications as of 01/18/2024  Medication Sig   b complex vitamins capsule Take 1 capsule by mouth daily.   Multiple Vitamins-Minerals (MENS MULTIVITAMIN PO) Take by mouth daily.   No facility-administered encounter medications on file as of 01/18/2024.    Allergies (verified) Codeine   History: Past Medical History:  Diagnosis Date   Chronic hepatitis C (HCC) 05/15/2018   Followed by Stephane Quest, NP   History of alcohol abuse    History of hepatitis C 2020   had 12 week treatment and does not have anymore   History of migraine    Left inguinal hernia 06/02/2019   Right knee pain 05/09/2018   Past Surgical History:  Procedure Laterality Date   INGUINAL HERNIA REPAIR Right 03/11/2014   Procedure: RIGHT INGUINAL HERNIA REPAIR ;  Surgeon: Debby Shipper, MD;  Location: [facility];  Service: General;  Laterality: Right;   INGUINAL HERNIA REPAIR Left 03/02/2020   Procedure: LEFT INGUINAL HERNIA REPAIR;  Surgeon: Shipper Debby, MD;  Location: [facility];  Service: General;  Laterality: Left;   INSERTION OF MESH Right 03/11/2014   Procedure: INSERTION OF MESH;  Surgeon: Debby Shipper, MD;  Location: [facility];  Service: General;  Laterality: Right;   INSERTION OF MESH Left 03/02/2020   Procedure: INSERTION OF MESH;  Surgeon: Shipper Debby, MD;  Location: [facility];  Service: General;  Laterality: Left;   KNEE ARTHROSCOPY  1995   right   Family History  Problem Relation Age  of Onset   Early death Mother    Hypertension Mother    Liver cancer Mother    Lung cancer Mother 51   Heart attack Father 35   Alcohol abuse Father    Alcohol abuse Brother    Drug abuse Brother    Asthma Son    Asthma Son    Healthy Maternal Grandmother    Alcohol abuse Maternal Grandfather 89   Dementia Paternal Grandmother    Aneurysm Paternal Grandfather 65   Social History   Socioeconomic History   Marital status: Married    Spouse name: Jon   Number of children: 4   Years of education: BFA in Dance   Highest education level: Not on file  Occupational History   Not on file  Tobacco Use   Smoking status: Former    Current packs/day: 0.00    Average packs/day: 0.5 packs/day for 5.0 years (2.5 ttl pk-yrs)    Types: Cigarettes    Start date: 2    Quit date: 1980    Years since quitting: 45.8   Smokeless tobacco: Never  Vaping Use   Vaping status: Never Used  Substance and Sexual Activity   Alcohol use: No    Comment: not since 1980-age 68 he quit   Drug use: Not Currently    Comment: no needles, and not since 1980   Sexual activity: Yes    Birth control/protection: None    Comment: pull out method  Other Topics Concern   Not on file  Social History Narrative   Works at [employer] - so occasionally difficult in the summer/winter   Lives with [place]    2 - Sadie and Herlene - teenagers   2 older children - Keneth and Larnell - live out of state   Enjoys: playing music, enjoys biking as well   Exercise: lifting light weights, walking   Diet: vegetarian, recently gave up sugar, watches for protein   Social Drivers of Health   Financial Resource Strain: Low Risk  (01/18/2024)   Overall Financial Resource Strain (CARDIA)    Difficulty of Paying Living Expenses: Not hard at all  Food Insecurity: No Food Insecurity (01/18/2024)   Hunger Vital Sign    Worried About Running Out of Food in the Last Year: Never true    Ran Out of Food in the Last Year: Never true   Transportation Needs: No Transportation Needs (01/18/2024)   PRAPARE - Administrator, Civil Service (Medical): No    Lack of Transportation (Non-Medical): No  Physical Activity: Sufficiently Active (01/18/2024)   Exercise Vital Sign    Days of Exercise per Week: 7 days    Minutes of Exercise per Session: 30 min  Stress: No Stress Concern Present (01/18/2024)   [employer] of Occupational Health - Occupational Stress Questionnaire    Feeling of Stress: Not at all  Social Connections: Socially Integrated (01/18/2024)   Social Connection and Isolation Panel    Frequency  of Communication with Friends and Family: More than three times a week    Frequency of Social Gatherings with Friends and Family: More than three times a week    Attends Religious Services: More than 4 times per year    Active Member of [employer] or Organizations: Yes    Attends Engineer, Structural: More than 4 times per year    Marital Status: Married    Tobacco Counseling Counseling given: Not Answered    Clinical Intake:  Pre-visit preparation completed: Yes  Pain : No/denies pain     BMI - recorded: 27.69 Nutritional Status: BMI 25 -29 Overweight Nutritional Risks: None Diabetes: No  Lab Results  Component Value Date   HGBA1C 5.1 05/09/2018     How often do you need to have someone help you when you read instructions, pamphlets, or other written materials from your doctor or pharmacy?: 1 - Never  Interpreter Needed?: No  Information entered by :: Verdie Saba, CMA   Activities of Daily Living     01/18/2024    1:18 PM  In your present state of health, do you have any difficulty performing the following activities:  Hearing? 0  Vision? 0  Difficulty concentrating or making decisions? 0  Walking or climbing stairs? 0  Dressing or bathing? 0  Doing errands, shopping? 0  Preparing Food and eating ? N  Using the Toilet? N  In the past six months, have you  accidently leaked urine? N  Do you have problems with loss of bowel control? N  Managing your Medications? N  Managing your Finances? N  Housekeeping or managing your Housekeeping? N    Patient Care Team: Gretta Comer POUR, NP as PCP - General (Internal Medicine) Hays Morrison, CRNP as Consulting Physician (Nurse Practitioner)  I have updated your Care Teams any recent Medical Services you may have received from other providers in the past year.     Assessment:   This is a routine wellness examination for [place].  Hearing/Vision screen Hearing Screening - Comments:: Denies hearing difficulties   Vision Screening - Comments:: Denies vision concerns   Goals Addressed               This Visit's Progress     Patient Stated (pt-stated)        Patient stated he plans to continue exercising        Depression Screen     01/18/2024    1:20 PM 01/30/2023    3:32 PM 01/11/2023   12:05 PM 11/08/2022    1:46 PM 11/03/2021    9:10 AM 06/02/2019    3:34 PM 05/09/2018    3:42 PM  PHQ 2/9 Scores  PHQ - 2 Score 0 0 0 0 0 0 0  PHQ- 9 Score 0 2 2    3     Fall Risk     01/18/2024    1:19 PM 01/30/2023    3:32 PM 01/11/2023   12:04 PM 11/08/2022    1:49 PM 11/03/2021    9:07 AM  Fall Risk   Falls in the past year? 0 0 0 0 0  Number falls in past yr: 0 0 0 0 0  Injury with Fall? 0 0 0 0 0  Risk for fall due to : No Fall Risks No Fall Risks No Fall Risks No Fall Risks Orthopedic patient  Follow up Falls evaluation completed;Falls prevention discussed Falls evaluation completed Falls evaluation completed Falls prevention discussed;Falls evaluation completed Falls  prevention discussed      Data saved with a previous flowsheet row definition    MEDICARE RISK AT HOME:  Medicare Risk at Home Any stairs in or around the home?: Yes If so, are there any without handrails?: No Home free of loose throw rugs in walkways, pet beds, electrical cords, etc?: Yes Adequate lighting in your  home to reduce risk of falls?: Yes Life alert?: Yes (alarm system) Use of a cane, walker or w/c?: No Grab bars in the bathroom?: No Shower chair or bench in shower?: No Elevated toilet seat or a handicapped toilet?: Yes  TIMED UP AND GO:  Was the test performed?  No  Cognitive Function: 6CIT completed        01/18/2024    1:22 PM 11/08/2022    1:54 PM 11/03/2021    9:13 AM  6CIT Screen  What Year? 0 points 0 points 0 points  What month? 0 points 0 points 0 points  What time? 0 points 0 points 0 points  Count back from 20 0 points 0 points 0 points  Months in reverse 0 points 0 points 0 points  Repeat phrase 0 points 0 points 4 points  Total Score 0 points 0 points 4 points    Immunizations Immunization History  Administered Date(s) Administered   Hep A, Unspecified 09/07/1998   Hepatitis A 09/07/1998   MMR 12/09/1987   PFIZER(Purple Top)SARS-COV-2 Vaccination 05/20/2019, 06/17/2019, 04/13/2020   PPD Test 01/16/2023   Rubella 12/09/1987   Td 05/21/1986, 10/09/2007, 09/07/2016   Yellow Fever 09/06/1993    Screening Tests Health Maintenance  Topic Date Due   Pneumococcal Vaccine: 50+ Years (1 of 2 - PCV) Never done   Zoster Vaccines- Shingrix (1 of 2) Never done   Fecal DNA (Cologuard)  Never done   Influenza Vaccine  Never done   COVID-19 Vaccine (4 - 2025-26 season) 11/19/2023   Medicare Annual Wellness (AWV)  01/17/2025   DTaP/Tdap/Td (4 - Tdap) 09/08/2026   Hepatitis C Screening  Completed   Meningococcal B Vaccine  Aged Out    Health Maintenance Items Addressed:  Cologuard status: pt has the kit and plans to complete  Additional Screening:  Vision Screening: Recommended annual ophthalmology exams for early detection of glaucoma and other disorders of the eye. Is the patient up to date with their annual eye exam?  Yes  Who is the provider or what is the name of the office in which the patient attends annual eye exams? Pt is unable to recall the name of  Optometrist  Dental Screening: Recommended annual dental exams for proper oral hygiene  Community Resource Referral / Chronic Care Management: CRR required this visit?  No   CCM required this visit?  No   Plan:    I have personally reviewed and noted the following in the patient's chart:   Medical and social history Use of alcohol, tobacco or illicit drugs  Current medications and supplements including opioid prescriptions. Patient is not currently taking opioid prescriptions. Functional ability and status Nutritional status Physical activity Advanced directives List of other physicians Hospitalizations, surgeries, and ER visits in previous 12 months Vitals Screenings to include cognitive, depression, and falls Referrals and appointments  In addition, I have reviewed and discussed with patient certain preventive protocols, quality metrics, and best practice recommendations. A written personalized care plan for preventive services as well as general preventive health recommendations were provided to patient.   Verdie CHRISTELLA Saba, CMA   01/18/2024  After Visit Summary: (Declined) Due to this being a telephonic visit, with patients personalized plan was offered to patient but patient Declined AVS at this time   Notes: Nothing significant to report at this time.

## 2024-01-18 NOTE — Patient Instructions (Signed)
 Derrick York,  Thank you for taking the time for your Medicare Wellness Visit. I appreciate your continued commitment to your health goals. Please review the care plan we discussed, and feel free to reach out if I can assist you further.  Medicare recommends these wellness visits once per year to help you and your care team stay ahead of potential health issues. These visits are designed to focus on prevention, allowing your provider to concentrate on managing your acute and chronic conditions during your regular appointments.  Please note that Annual Wellness Visits do not include a physical exam. Some assessments may be limited, especially if the visit was conducted virtually. If needed, we may recommend a separate in-person follow-up with your provider.  Ongoing Care Seeing your primary care provider every 3 to 6 months helps us  monitor your health and provide consistent, personalized care.   Referrals If a referral was made during today's visit and you haven't received any updates within two weeks, please contact the referred provider directly to check on the status.  Recommended Screenings:  Health Maintenance  Topic Date Due   Pneumococcal Vaccine for age over 22 (1 of 2 - PCV) Never done   Zoster (Shingles) Vaccine (1 of 2) Never done   Cologuard (Stool DNA test)  Never done   Flu Shot  Never done   COVID-19 Vaccine (4 - 2025-26 season) 11/19/2023   Medicare Annual Wellness Visit  01/17/2025   DTaP/Tdap/Td vaccine (4 - Tdap) 09/08/2026   Hepatitis C Screening  Completed   Meningitis B Vaccine  Aged Out       11/08/2022    1:51 PM  Advanced Directives  Does Patient Have a Medical Advance Directive? Yes  Type of Estate Agent of [place];Living will  Copy of Healthcare Power of Attorney in Chart? No - copy requested   Advance Care Planning is important because it: Ensures you receive medical care that aligns with your values, goals, and  preferences. Provides guidance to your family and loved ones, reducing the emotional burden of decision-making during critical moments.  Vision: Annual vision screenings are recommended for early detection of glaucoma, cataracts, and diabetic retinopathy. These exams can also reveal signs of chronic conditions such as diabetes and high blood pressure.  Dental: Annual dental screenings help detect early signs of oral cancer, gum disease, and other conditions linked to overall health, including heart disease and diabetes.

## 2024-01-23 ENCOUNTER — Encounter

## 2024-02-06 ENCOUNTER — Ambulatory Visit: Admitting: Primary Care

## 2024-03-21 ENCOUNTER — Ambulatory Visit: Payer: Self-pay | Admitting: Primary Care

## 2024-03-21 ENCOUNTER — Ambulatory Visit: Admitting: Primary Care

## 2024-03-21 ENCOUNTER — Encounter: Payer: Self-pay | Admitting: Primary Care

## 2024-03-21 ENCOUNTER — Ambulatory Visit
Admission: RE | Admit: 2024-03-21 | Discharge: 2024-03-21 | Disposition: A | Discharge status: Home / Self Care | Source: Ambulatory Visit | Attending: Primary Care | Admitting: Primary Care

## 2024-03-21 VITALS — BP 120/60 | HR 65 | Temp 100.4°F | Ht 70.0 in | Wt 170.0 lb

## 2024-03-21 DIAGNOSIS — R051 Acute cough: Secondary | ICD-10-CM

## 2024-03-21 LAB — POC COVID19 BINAXNOW: SARS Coronavirus 2 Ag: NEGATIVE

## 2024-03-21 LAB — POCT INFLUENZA A/B
Influenza A, POC: NEGATIVE
Influenza B, POC: NEGATIVE

## 2024-03-21 MED ORDER — PREDNISONE 20 MG PO TABS: ORAL_TABLET | ORAL | 0 refills | Status: DC

## 2024-03-21 MED ORDER — ALBUTEROL SULFATE HFA 108 (90 BASE) MCG/ACT IN AERS: 2.0000 | INHALATION_SPRAY | Freq: Four times a day (QID) | RESPIRATORY_TRACT | 0 refills | Status: AC | PRN

## 2024-03-21 MED ORDER — AMOXICILLIN-POT CLAVULANATE 875-125 MG PO TABS: 1.0000 | ORAL_TABLET | Freq: Two times a day (BID) | ORAL | 0 refills | Status: AC

## 2024-03-21 MED ORDER — BENZONATATE 200 MG PO CAPS: 200.0000 mg | ORAL_CAPSULE | Freq: Three times a day (TID) | ORAL | 0 refills | Status: AC | PRN

## 2024-03-21 NOTE — Progress Notes (Signed)
 "  Subjective:    Patient ID: Derrick York, male    DOB: 1956/02/25, 69 y.o.   MRN: [id number]  Derrick York is a very pleasant 69 y.o. male with a history of hepatitis C, tobacco use who presents today to discuss acute cough.  Symptom onset one month ago with a dry cough intermittently. Since then his cough has gradually become more intense and frequent. He's also developed an intermittent runny nose. Over the last 3 days his symptoms have progressed moderately. He began noticing a fever over the last 24 hours.   He denies sore throat, body aches, chills, chest tightness, shortness of breath. He's been taking home remedies without improvement.    Review of Systems  Constitutional:  Positive for fatigue and fever.  HENT:  Negative for congestion, ear pain, postnasal drip and sore throat.   Respiratory:  Positive for cough.          Past Medical History:  Diagnosis Date   Chronic hepatitis C (HCC) 05/15/2018   Followed by Stephane Quest, NP   History of alcohol abuse    History of hepatitis C 2020   had 12 week treatment and does not have anymore   History of migraine    Left inguinal hernia 06/02/2019   Right knee pain 05/09/2018    Social History   Socioeconomic History   Marital status: Married    Spouse name: Derrick York   Number of children: 4   Years of education: BFA in Dance   Highest education level: Not on file  Occupational History   Not on file  Tobacco Use   Smoking status: Former    Current packs/day: 0.00    Average packs/day: 0.5 packs/day for 5.0 years (2.5 ttl pk-yrs)    Types: Cigarettes    Start date: 25    Quit date: 1980    Years since quitting: 46.0   Smokeless tobacco: Never  Vaping Use   Vaping status: Never Used  Substance and Sexual Activity   Alcohol use: No    Comment: not since 1980-age 44 he quit   Drug use: Not Currently    Comment: no needles, and not since 1980   Sexual activity: Yes    Birth control/protection: None     Comment: pull out method  Other Topics Concern   Not on file  Social History Narrative   Works at [employer] - so occasionally difficult in the summer/winter   Lives with [place]    2 - Sadie and Herlene - teenagers   2 older children - Keneth and Larnell - live out of state   Enjoys: playing music, enjoys biking as well   Exercise: lifting light weights, walking   Diet: vegetarian, recently gave up sugar, watches for protein   Social Drivers of Health   Tobacco Use: Medium Risk (03/21/2024)   Patient History    Smoking Tobacco Use: Former    Smokeless Tobacco Use: Never    Passive Exposure: Not on Actuary Strain: Low Risk (01/18/2024)   Overall Financial Resource Strain (CARDIA)    Difficulty of Paying Living Expenses: Not hard at all  Food Insecurity: No Food Insecurity (01/18/2024)   Epic    Worried About Programme Researcher, Broadcasting/film/video in the Last Year: Never true    Ran Out of Food in the Last Year: Never true  Transportation Needs: No Transportation Needs (01/18/2024)   Epic    Lack of Transportation (Medical): No  Lack of Transportation (Non-Medical): No  Physical Activity: Sufficiently Active (01/18/2024)   Exercise Vital Sign    Days of Exercise per Week: 7 days    Minutes of Exercise per Session: 30 min  Stress: No Stress Concern Present (01/18/2024)   [employer] of Occupational Health - Occupational Stress Questionnaire    Feeling of Stress: Not at all  Social Connections: Socially Integrated (01/18/2024)   Social Connection and Isolation Panel    Frequency of Communication with Friends and Family: More than three times a week    Frequency of Social Gatherings with Friends and Family: More than three times a week    Attends Religious Services: More than 4 times per year    Active Member of Clubs or Organizations: Yes    Attends Banker Meetings: More than 4 times per year    Marital Status: Married  Catering Manager Violence: Not At Risk (01/18/2024)    Epic    Fear of Current or Ex-Partner: No    Emotionally Abused: No    Physically Abused: No    Sexually Abused: No  Depression (PHQ2-9): Low Risk (03/21/2024)   Depression (PHQ2-9)    PHQ-2 Score: 4  Alcohol Screen: Low Risk (01/18/2024)   Alcohol Screen    Last Alcohol Screening Score (AUDIT): 0  Housing: Unknown (01/18/2024)   Epic    Unable to Pay for Housing in the Last Year: No    Number of Times Moved in the Last Year: Not on file    Homeless in the Last Year: No  Utilities: Not At Risk (01/18/2024)   Epic    Threatened with loss of utilities: No  Health Literacy: Adequate Health Literacy (01/18/2024)   B1300 Health Literacy    Frequency of need for help with medical instructions: Never    Past Surgical History:  Procedure Laterality Date   INGUINAL HERNIA REPAIR Right 03/11/2014   Procedure: RIGHT INGUINAL HERNIA REPAIR ;  Surgeon: Debby Shipper, MD;  Location: [facility];  Service: General;  Laterality: Right;   INGUINAL HERNIA REPAIR Left 03/02/2020   Procedure: LEFT INGUINAL HERNIA REPAIR;  Surgeon: Shipper Debby, MD;  Location: [facility];  Service: General;  Laterality: Left;   INSERTION OF MESH Right 03/11/2014   Procedure: INSERTION OF MESH;  Surgeon: Debby Shipper, MD;  Location: [facility];  Service: General;  Laterality: Right;   INSERTION OF MESH Left 03/02/2020   Procedure: INSERTION OF MESH;  Surgeon: Shipper Debby, MD;  Location: [facility];  Service: General;  Laterality: Left;   KNEE ARTHROSCOPY  1995   right    Family History  Problem Relation Age of Onset   Early death Mother    Hypertension Mother    Liver cancer Mother    Lung cancer Mother 36   Heart attack Father 11   Alcohol abuse Father    Alcohol abuse Brother    Drug abuse Brother    Asthma Son    Asthma Son    Healthy Maternal Grandmother    Alcohol abuse Maternal Grandfather 23   Dementia Paternal Grandmother     Aneurysm Paternal Grandfather 56    Allergies[1]  Medications Ordered Prior to Encounter[2]  BP 120/60   Pulse 65   Temp (!) 100.4 F (38 C) (Oral)   Ht 5' 10 (1.778 m)   Wt 170 lb (77.1 kg)   SpO2 100%   BMI 24.39 kg/m  Objective:   Physical  Exam Constitutional:      Appearance: He is not ill-appearing.  HENT:     Right Ear: Tympanic membrane and ear canal normal.     Left Ear: Tympanic membrane and ear canal normal.     Nose: No mucosal edema.     Right Sinus: No maxillary sinus tenderness or frontal sinus tenderness.     Left Sinus: No maxillary sinus tenderness or frontal sinus tenderness.  Eyes:     Conjunctiva/sclera: Conjunctivae normal.  Cardiovascular:     Rate and Rhythm: Normal rate and regular rhythm.  Pulmonary:     Effort: Pulmonary effort is normal.     Breath sounds: Examination of the right-lower field reveals rhonchi. Examination of the left-lower field reveals rhonchi. Rhonchi present.  Musculoskeletal:     Cervical back: Neck supple.  Skin:    General: Skin is warm and dry.     Physical Exam        Assessment & Plan:  Acute cough Assessment & Plan: Respiratory exam today questionable.   Rapid influenza and Covid tests negative today. Chest xray ordered and pending.  Start Augmentin antibiotics. Take 1 tablet by mouth twice daily for 7 days. Start Benzonatate capsules for cough. Take 1 capsule by mouth three times daily as needed for cough.  Await results.   Orders: -     POCT Influenza A/B -     POC COVID-19 BinaxNow -     DG Chest 2 View -     Amoxicillin-Pot Clavulanate; Take 1 tablet by mouth 2 (two) times daily.  Dispense: 14 tablet; Refill: 0 -     Benzonatate; Take 1 capsule (200 mg total) by mouth 3 (three) times daily as needed for cough.  Dispense: 15 capsule; Refill: 0    Assessment and Plan Assessment & Plan         Comer MARLA Gaskins, NP       [1]  Allergies Allergen Reactions   Codeine Nausea And  Vomiting  [2]  Current Outpatient Medications on File Prior to Visit  Medication Sig Dispense Refill   b complex vitamins capsule Take 1 capsule by mouth daily.     Multiple Vitamins-Minerals (MENS MULTIVITAMIN PO) Take by mouth daily.     No current facility-administered medications on file prior to visit.   "

## 2024-03-21 NOTE — Assessment & Plan Note (Signed)
 Respiratory exam today questionable.   Rapid influenza and Covid tests negative today. Chest xray ordered and pending.  Start Augmentin antibiotics. Take 1 tablet by mouth twice daily for 7 days. Start Benzonatate capsules for cough. Take 1 capsule by mouth three times daily as needed for cough.  Await results.

## 2024-03-21 NOTE — Patient Instructions (Signed)
 Start Augmentin antibiotics. Take 1 tablet by mouth twice daily for 7 days.  Start Benzonatate capsules for cough. Take 1 capsule by mouth three times daily as needed for cough.  Complete xray(s) prior to leaving today. I will notify you of your results once received.  It was a pleasure to see you today!

## 2024-03-26 ENCOUNTER — Ambulatory Visit: Admitting: Primary Care

## 2024-03-26 ENCOUNTER — Encounter: Payer: Self-pay | Admitting: Primary Care

## 2024-03-26 ENCOUNTER — Ambulatory Visit: Payer: Self-pay | Admitting: Primary Care

## 2024-03-26 VITALS — BP 118/80 | HR 88 | Temp 97.9°F | Ht 69.5 in | Wt 163.2 lb

## 2024-03-26 DIAGNOSIS — Z1211 Encounter for screening for malignant neoplasm of colon: Secondary | ICD-10-CM

## 2024-03-26 DIAGNOSIS — Z0001 Encounter for general adult medical examination with abnormal findings: Secondary | ICD-10-CM

## 2024-03-26 DIAGNOSIS — Z13220 Encounter for screening for lipoid disorders: Secondary | ICD-10-CM | POA: Diagnosis not present

## 2024-03-26 DIAGNOSIS — Z125 Encounter for screening for malignant neoplasm of prostate: Secondary | ICD-10-CM

## 2024-03-26 DIAGNOSIS — Z0000 Encounter for general adult medical examination without abnormal findings: Secondary | ICD-10-CM

## 2024-03-26 DIAGNOSIS — R051 Acute cough: Secondary | ICD-10-CM | POA: Diagnosis not present

## 2024-03-26 DIAGNOSIS — E871 Hypo-osmolality and hyponatremia: Secondary | ICD-10-CM

## 2024-03-26 LAB — LIPID PANEL
Cholesterol: 115 mg/dL (ref 28–200)
HDL: 30.4 mg/dL — ABNORMAL LOW
LDL Cholesterol: 73 mg/dL (ref 10–99)
NonHDL: 84.54
Total CHOL/HDL Ratio: 4
Triglycerides: 56 mg/dL (ref 10.0–149.0)
VLDL: 11.2 mg/dL (ref 0.0–40.0)

## 2024-03-26 LAB — COMPREHENSIVE METABOLIC PANEL WITH GFR
ALT: 20 U/L (ref 3–53)
AST: 28 U/L (ref 5–37)
Albumin: 3.5 g/dL (ref 3.5–5.2)
Alkaline Phosphatase: 103 U/L (ref 39–117)
BUN: 11 mg/dL (ref 6–23)
CO2: 28 meq/L (ref 19–32)
Calcium: 8.9 mg/dL (ref 8.4–10.5)
Chloride: 96 meq/L (ref 96–112)
Creatinine, Ser: 0.59 mg/dL (ref 0.40–1.50)
GFR: 98.93 mL/min
Glucose, Bld: 90 mg/dL (ref 70–99)
Potassium: 4.7 meq/L (ref 3.5–5.1)
Sodium: 130 meq/L — ABNORMAL LOW (ref 135–145)
Total Bilirubin: 0.6 mg/dL (ref 0.2–1.2)
Total Protein: 7.4 g/dL (ref 6.0–8.3)

## 2024-03-26 LAB — PSA: PSA: 1.48 ng/mL (ref 0.10–4.00)

## 2024-03-26 NOTE — Patient Instructions (Signed)
 Stop by the lab prior to leaving today. I will notify you of your results once received.   Complete the Cologuard kit once received.  It was a pleasure to see you today!

## 2024-03-26 NOTE — Assessment & Plan Note (Signed)
 Declines all vaccines. Colonoscopy overdue, he declines colonoscopy but opts for Cologuard PSA due and pending.  Discussed the importance of a healthy diet and regular exercise in order for weight loss, and to reduce the risk of further co-morbidity.  Exam stable. Labs pending.  Follow up in 1 year for repeat physical.

## 2024-03-26 NOTE — Assessment & Plan Note (Signed)
 Improving.   Continue Augmentin  antibiotics.

## 2024-03-26 NOTE — Progress Notes (Signed)
 "  Subjective:    Patient ID: Derrick York, male    DOB: 12/05/1955, 69 y.o.   MRN: [id number]  Derrick York is a very pleasant 69 y.o. male who presents today for complete physical and follow up of chronic conditions.  Immunizations: -Tetanus: Completed in 2018 -Influenza: Declines  -Shingles: Never completed, declines  -Pneumonia: Never completed, declines   Diet: Fair diet.  Exercise: Regular exercise.  Eye exam: Completed years ago  Dental exam: Completes semi-annually    Colonoscopy: Never completed   PSA: Due  BP Readings from Last 3 Encounters:  03/26/24 118/80  03/21/24 120/60  01/30/23 114/62     Review of Systems  Constitutional:  Negative for unexpected weight change.  HENT:  Negative for rhinorrhea.   Respiratory:  Positive for cough. Negative for shortness of breath.        Improved cough since last visit  Cardiovascular:  Negative for chest pain.  Gastrointestinal:  Negative for constipation and diarrhea.  Genitourinary:  Negative for difficulty urinating.  Musculoskeletal:  Negative for arthralgias and myalgias.  Skin:  Negative for rash.  Allergic/Immunologic: Negative for environmental allergies.  Neurological:  Negative for dizziness and headaches.  Psychiatric/Behavioral:  The patient is not nervous/anxious.          Past Medical History:  Diagnosis Date   Chronic hepatitis C (HCC) 05/15/2018   Followed by Stephane Quest, NP   History of alcohol abuse    History of hepatitis C 2020   had 12 week treatment and does not have anymore   History of migraine    Left inguinal hernia 06/02/2019   Right knee pain 05/09/2018    Social History   Socioeconomic History   Marital status: Married    Spouse name: Jon   Number of children: 4   Years of education: BFA in Dance   Highest education level: Not on file  Occupational History   Not on file  Tobacco Use   Smoking status: Former    Current packs/day: 0.00    Average  packs/day: 0.5 packs/day for 5.0 years (2.5 ttl pk-yrs)    Types: Cigarettes    Start date: 64    Quit date: 1980    Years since quitting: 46.0   Smokeless tobacco: Never  Vaping Use   Vaping status: Never Used  Substance and Sexual Activity   Alcohol use: No    Comment: not since 1980-age 40 he quit   Drug use: Not Currently    Comment: no needles, and not since 1980   Sexual activity: Yes    Birth control/protection: None    Comment: pull out method  Other Topics Concern   Not on file  Social History Narrative   Works at [employer] - so occasionally difficult in the summer/winter   Lives with [place]    2 - Sadie and Herlene - teenagers   2 older children - Keneth and Larnell - live out of state   Enjoys: playing music, enjoys biking as well   Exercise: lifting light weights, walking   Diet: vegetarian, recently gave up sugar, watches for protein   Social Drivers of Health   Tobacco Use: Medium Risk (03/26/2024)   Patient History    Smoking Tobacco Use: Former    Smokeless Tobacco Use: Never    Passive Exposure: Not on Actuary Strain: Low Risk (01/18/2024)   Overall Financial Resource Strain (CARDIA)    Difficulty of Paying Living Expenses: Not hard at  all  Food Insecurity: No Food Insecurity (01/18/2024)   Epic    Worried About Programme Researcher, Broadcasting/film/video in the Last Year: Never true    Ran Out of Food in the Last Year: Never true  Transportation Needs: No Transportation Needs (01/18/2024)   Epic    Lack of Transportation (Medical): No    Lack of Transportation (Non-Medical): No  Physical Activity: Sufficiently Active (01/18/2024)   Exercise Vital Sign    Days of Exercise per Week: 7 days    Minutes of Exercise per Session: 30 min  Stress: No Stress Concern Present (01/18/2024)   [employer] of Occupational Health - Occupational Stress Questionnaire    Feeling of Stress: Not at all  Social Connections: Socially Integrated (01/18/2024)   Social Connection and  Isolation Panel    Frequency of Communication with Friends and Family: More than three times a week    Frequency of Social Gatherings with Friends and Family: More than three times a week    Attends Religious Services: More than 4 times per year    Active Member of Clubs or Organizations: Yes    Attends Banker Meetings: More than 4 times per year    Marital Status: Married  Catering Manager Violence: Not At Risk (01/18/2024)   Epic    Fear of Current or Ex-Partner: No    Emotionally Abused: No    Physically Abused: No    Sexually Abused: No  Depression (PHQ2-9): Medium Risk (03/26/2024)   Depression (PHQ2-9)    PHQ-2 Score: 5  Alcohol Screen: Low Risk (01/18/2024)   Alcohol Screen    Last Alcohol Screening Score (AUDIT): 0  Housing: Unknown (01/18/2024)   Epic    Unable to Pay for Housing in the Last Year: No    Number of Times Moved in the Last Year: Not on file    Homeless in the Last Year: No  Utilities: Not At Risk (01/18/2024)   Epic    Threatened with loss of utilities: No  Health Literacy: Adequate Health Literacy (01/18/2024)   B1300 Health Literacy    Frequency of need for help with medical instructions: Never    Past Surgical History:  Procedure Laterality Date   INGUINAL HERNIA REPAIR Right 03/11/2014   Procedure: RIGHT INGUINAL HERNIA REPAIR ;  Surgeon: Debby Shipper, MD;  Location: [facility];  Service: General;  Laterality: Right;   INGUINAL HERNIA REPAIR Left 03/02/2020   Procedure: LEFT INGUINAL HERNIA REPAIR;  Surgeon: Shipper Debby, MD;  Location: [facility];  Service: General;  Laterality: Left;   INSERTION OF MESH Right 03/11/2014   Procedure: INSERTION OF MESH;  Surgeon: Debby Shipper, MD;  Location: [facility];  Service: General;  Laterality: Right;   INSERTION OF MESH Left 03/02/2020   Procedure: INSERTION OF MESH;  Surgeon: Shipper Debby, MD;  Location: [facility];  Service:  General;  Laterality: Left;   KNEE ARTHROSCOPY  1995   right    Family History  Problem Relation Age of Onset   Early death Mother    Hypertension Mother    Liver cancer Mother    Lung cancer Mother 94   Heart attack Father 47   Alcohol abuse Father    Alcohol abuse Brother    Drug abuse Brother    Asthma Son    Asthma Son    Healthy Maternal Grandmother    Alcohol abuse Maternal Grandfather 55   Dementia Paternal Grandmother  Aneurysm Paternal Grandfather 67    Allergies[1]  Medications Ordered Prior to Encounter[2]  BP 118/80   Pulse 88   Temp 97.9 F (36.6 C) (Oral)   Ht 5' 9.5 (1.765 m)   Wt 163 lb 4 oz (74 kg)   SpO2 97%   BMI 23.76 kg/m  Objective:   Physical Exam HENT:     Right Ear: Tympanic membrane and ear canal normal.     Left Ear: Tympanic membrane and ear canal normal.  Eyes:     Pupils: Pupils are equal, round, and reactive to light.  Cardiovascular:     Rate and Rhythm: Normal rate and regular rhythm.  Pulmonary:     Effort: Pulmonary effort is normal.     Breath sounds: Normal breath sounds.  Abdominal:     General: Bowel sounds are normal.     Palpations: Abdomen is soft.     Tenderness: There is no abdominal tenderness.  Musculoskeletal:        General: Normal range of motion.     Cervical back: Neck supple.  Skin:    General: Skin is warm and dry.  Neurological:     Mental Status: He is alert and oriented to person, place, and time.     Cranial Nerves: No cranial nerve deficit.     Deep Tendon Reflexes:     Reflex Scores:      Patellar reflexes are 2+ on the right side and 2+ on the left side. Psychiatric:        Mood and Affect: Mood normal.     Physical Exam        Assessment & Plan:  Preventative health care Assessment & Plan: Declines all vaccines. Colonoscopy overdue, he declines colonoscopy but opts for Cologuard PSA due and pending.  Discussed the importance of a healthy diet and regular exercise in order  for weight loss, and to reduce the risk of further co-morbidity.  Exam stable. Labs pending.  Follow up in 1 year for repeat physical.   Orders: -     Lipid panel -     Comprehensive metabolic panel with GFR  Screening for colon cancer -     Cologuard  Acute cough Assessment & Plan: Improving.   Continue Augmentin  antibiotics.     Screening for prostate cancer -     PSA    Assessment and Plan Assessment & Plan         Comer MARLA Gaskins, NP       [1]  Allergies Allergen Reactions   Codeine Nausea And Vomiting  [2]  Current Outpatient Medications on File Prior to Visit  Medication Sig Dispense Refill   albuterol  (VENTOLIN  HFA) 108 (90 Base) MCG/ACT inhaler Inhale 2 puffs into the lungs every 6 (six) hours as needed for shortness of breath. 1 each 0   amoxicillin -clavulanate (AUGMENTIN ) 875-125 MG tablet Take 1 tablet by mouth 2 (two) times daily. 14 tablet 0   b complex vitamins capsule Take 1 capsule by mouth daily.     benzonatate  (TESSALON ) 200 MG capsule Take 1 capsule (200 mg total) by mouth 3 (three) times daily as needed for cough. 15 capsule 0   Multiple Vitamins-Minerals (MENS MULTIVITAMIN PO) Take by mouth daily.     No current facility-administered medications on file prior to visit.   "

## 2025-02-10 ENCOUNTER — Encounter: Admitting: Primary Care

## 2025-02-10 ENCOUNTER — Ambulatory Visit
